# Patient Record
Sex: Female | Born: 1995 | Race: White | Hispanic: No | Marital: Single | Smoking: Never smoker
Health system: Southern US, Community
[De-identification: ages and names within clinical notes are randomized; demographics above are authoritative.]

## PROBLEM LIST (undated history)

## (undated) ENCOUNTER — Inpatient Hospital Stay (HOSPITAL_COMMUNITY): Payer: Self-pay

## (undated) DIAGNOSIS — R51 Headache: Secondary | ICD-10-CM

## (undated) DIAGNOSIS — F909 Attention-deficit hyperactivity disorder, unspecified type: Secondary | ICD-10-CM

## (undated) DIAGNOSIS — F32A Depression, unspecified: Secondary | ICD-10-CM

## (undated) DIAGNOSIS — F329 Major depressive disorder, single episode, unspecified: Secondary | ICD-10-CM

## (undated) HISTORY — DX: Headache: R51

---
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
## 2017-02-14 ENCOUNTER — Encounter (HOSPITAL_COMMUNITY): Payer: Self-pay

## 2017-02-14 ENCOUNTER — Emergency Department (HOSPITAL_COMMUNITY)
Admission: EM | Admit: 2017-02-14 | Discharge: 2017-02-14 | Discharge status: Against Medical Advice | Payer: Medicaid Other | Attending: Emergency Medicine | Admitting: Emergency Medicine

## 2017-02-14 DIAGNOSIS — R300 Dysuria: Secondary | ICD-10-CM | POA: Diagnosis not present

## 2017-02-14 DIAGNOSIS — Z5321 Procedure and treatment not carried out due to patient leaving prior to being seen by health care provider: Secondary | ICD-10-CM | POA: Diagnosis not present

## 2017-02-14 NOTE — ED Notes (Signed)
Called patient , but no answer

## 2017-02-14 NOTE — ED Notes (Signed)
Bed: WA13 Expected date:  Expected time:  Means of arrival:  Comments: 

## 2017-02-14 NOTE — ED Triage Notes (Signed)
Patient c/o hematuria and dysuria x 3-4 days ago. Patient denies any vaginal discharge.

## 2017-04-29 ENCOUNTER — Encounter (HOSPITAL_COMMUNITY): Payer: Self-pay | Admitting: Emergency Medicine

## 2017-04-29 DIAGNOSIS — Z5321 Procedure and treatment not carried out due to patient leaving prior to being seen by health care provider: Secondary | ICD-10-CM | POA: Insufficient documentation

## 2017-04-29 DIAGNOSIS — R1032 Left lower quadrant pain: Secondary | ICD-10-CM | POA: Diagnosis not present

## 2017-04-29 DIAGNOSIS — R309 Painful micturition, unspecified: Secondary | ICD-10-CM | POA: Insufficient documentation

## 2017-04-29 NOTE — ED Triage Notes (Signed)
Pt c/o left flank pain 8/10 for the past 2 days with some discomfort with urination, no fever, chills, nausea or vomiting.

## 2017-04-30 ENCOUNTER — Emergency Department (HOSPITAL_COMMUNITY): Payer: Medicaid Other

## 2017-04-30 ENCOUNTER — Emergency Department (HOSPITAL_COMMUNITY)
Admission: EM | Admit: 2017-04-30 | Discharge: 2017-04-30 | Disposition: A | Discharge status: Home / Self Care | Payer: Medicaid Other | Attending: Emergency Medicine | Admitting: Emergency Medicine

## 2017-04-30 DIAGNOSIS — R109 Unspecified abdominal pain: Secondary | ICD-10-CM

## 2017-04-30 LAB — URINALYSIS, ROUTINE W REFLEX MICROSCOPIC
Bacteria, UA: NONE SEEN
Bilirubin Urine: NEGATIVE
GLUCOSE, UA: NEGATIVE mg/dL
Hgb urine dipstick: NEGATIVE
Ketones, ur: NEGATIVE mg/dL
Nitrite: NEGATIVE
PH: 6 (ref 5.0–8.0)
Protein, ur: NEGATIVE mg/dL
SPECIFIC GRAVITY, URINE: 1.018 (ref 1.005–1.030)

## 2017-04-30 LAB — BASIC METABOLIC PANEL
ANION GAP: 9 (ref 5–15)
BUN: 10 mg/dL (ref 6–20)
CHLORIDE: 103 mmol/L (ref 101–111)
CO2: 21 mmol/L — ABNORMAL LOW (ref 22–32)
CREATININE: 0.58 mg/dL (ref 0.44–1.00)
Calcium: 8.9 mg/dL (ref 8.9–10.3)
GFR calc non Af Amer: >60 mL/min (ref >60)
Glucose, Bld: 99 mg/dL (ref 65–99)
POTASSIUM: 3.6 mmol/L (ref 3.5–5.1)
SODIUM: 133 mmol/L — AB (ref 135–145)

## 2017-04-30 LAB — I-STAT BETA HCG BLOOD, ED (MC, WL, AP ONLY): I-stat hCG, quantitative: >2000 m[IU]/mL — ABNORMAL HIGH (ref <5)

## 2017-04-30 LAB — CBC
HCT: 35.3 % — ABNORMAL LOW (ref 36.0–46.0)
HEMOGLOBIN: 11.4 g/dL — AB (ref 12.0–15.0)
MCH: 25.8 pg — ABNORMAL LOW (ref 26.0–34.0)
MCHC: 32.3 g/dL (ref 30.0–36.0)
MCV: 79.9 fL (ref 78.0–100.0)
PLATELETS: 260 10*3/uL (ref 150–400)
RBC: 4.42 MIL/uL (ref 3.87–5.11)
RDW: 15.1 % (ref 11.5–15.5)
WBC: 7.4 10*3/uL (ref 4.0–10.5)

## 2017-04-30 LAB — HCG, QUANTITATIVE, PREGNANCY: hCG, Beta Chain, Quant, S: 2861 m[IU]/mL — ABNORMAL HIGH (ref <5)

## 2017-04-30 NOTE — ED Notes (Signed)
Called pt no answer °

## 2017-04-30 NOTE — ED Notes (Signed)
Called multiple times , no answer.

## 2017-05-17 LAB — OB RESULTS CONSOLE GC/CHLAMYDIA
Chlamydia: NEGATIVE
GC PROBE AMP, GENITAL: NEGATIVE

## 2017-06-20 LAB — OB RESULTS CONSOLE ABO/RH: RH Type: NEGATIVE

## 2017-06-20 LAB — OB RESULTS CONSOLE ANTIBODY SCREEN: ANTIBODY SCREEN: NEGATIVE

## 2017-06-20 LAB — OB RESULTS CONSOLE HIV ANTIBODY (ROUTINE TESTING): HIV: NONREACTIVE

## 2017-06-20 LAB — OB RESULTS CONSOLE RUBELLA ANTIBODY, IGM: Rubella: IMMUNE

## 2017-06-20 LAB — OB RESULTS CONSOLE HEPATITIS B SURFACE ANTIGEN: Hepatitis B Surface Ag: NEGATIVE

## 2017-06-20 LAB — OB RESULTS CONSOLE RPR: RPR: NONREACTIVE

## 2017-06-28 ENCOUNTER — Encounter (HOSPITAL_COMMUNITY): Payer: Self-pay

## 2017-06-28 ENCOUNTER — Inpatient Hospital Stay (HOSPITAL_COMMUNITY)
Admission: AD | Admit: 2017-06-28 | Discharge: 2017-06-28 | Disposition: A | Discharge status: Home / Self Care | Payer: Medicaid Other | Source: Ambulatory Visit | Attending: Obstetrics and Gynecology | Admitting: Obstetrics and Gynecology

## 2017-06-28 DIAGNOSIS — W009XXA Unspecified fall due to ice and snow, initial encounter: Secondary | ICD-10-CM

## 2017-06-28 DIAGNOSIS — M545 Low back pain, unspecified: Secondary | ICD-10-CM

## 2017-06-28 DIAGNOSIS — W000XXA Fall on same level due to ice and snow, initial encounter: Secondary | ICD-10-CM | POA: Diagnosis not present

## 2017-06-28 DIAGNOSIS — O26891 Other specified pregnancy related conditions, first trimester: Secondary | ICD-10-CM | POA: Diagnosis not present

## 2017-06-28 DIAGNOSIS — O9A211 Injury, poisoning and certain other consequences of external causes complicating pregnancy, first trimester: Secondary | ICD-10-CM

## 2017-06-28 DIAGNOSIS — Z3491 Encounter for supervision of normal pregnancy, unspecified, first trimester: Secondary | ICD-10-CM

## 2017-06-28 DIAGNOSIS — S3992XA Unspecified injury of lower back, initial encounter: Secondary | ICD-10-CM

## 2017-06-28 DIAGNOSIS — Z3A12 12 weeks gestation of pregnancy: Secondary | ICD-10-CM | POA: Insufficient documentation

## 2017-06-28 LAB — URINALYSIS, ROUTINE W REFLEX MICROSCOPIC
Bilirubin Urine: NEGATIVE
GLUCOSE, UA: 50 mg/dL — AB
HGB URINE DIPSTICK: NEGATIVE
Ketones, ur: 20 mg/dL — AB
NITRITE: NEGATIVE
PH: 5 (ref 5.0–8.0)
Protein, ur: 30 mg/dL — AB
SPECIFIC GRAVITY, URINE: 1.031 — AB (ref 1.005–1.030)

## 2017-06-28 MED ORDER — ACETAMINOPHEN 500 MG PO TABS
1000.0000 mg | ORAL_TABLET | Freq: Once | ORAL | Status: AC
  Administered 2017-06-28: 1000 mg via ORAL
  Filled 2017-06-28: qty 2

## 2017-06-28 NOTE — MAU Note (Signed)
Pt fell at 1000 this morning on ice. Having back pain. No bleeding or LOF.

## 2017-06-28 NOTE — Discharge Instructions (Signed)

## 2017-06-28 NOTE — MAU Note (Signed)
Tried to review pt history but pt denied the TAB and all of the medical history I was reviewing. I also asked about the ER visit with the knee contusion to try to see things were charted under the wrong patient and she denied that also. I verified name DOB and SSN again and all matched her records.
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
## 2017-07-18 NOTE — L&D Delivery Note (Signed)
Patient was C/C/station not documented and pushed for approx 60 minutes with epidural.    NSVD  Female infant, Apgars 8/9, weight pending.   The patient had left labial laceration repaired with 3-0 vicryl. Fundus was firm. EBL was expected amount. Placenta was delivered intact. Vagina was clear.  Delayed cord clamping done for 30-60 seconds while warming baby. Baby was vigorous and doing skin to skin with mother.  Philip AspenALLAHAN, [place]un Monteverde

## 2017-09-26 ENCOUNTER — Inpatient Hospital Stay (HOSPITAL_COMMUNITY)
Admission: AD | Admit: 2017-09-26 | Discharge: 2017-09-26 | Disposition: A | Discharge status: Home / Self Care | Payer: Medicaid Other | Source: Ambulatory Visit | Attending: Obstetrics and Gynecology | Admitting: Obstetrics and Gynecology

## 2017-09-26 ENCOUNTER — Encounter (HOSPITAL_COMMUNITY): Payer: Self-pay | Admitting: *Deleted

## 2017-09-26 DIAGNOSIS — Z3A25 25 weeks gestation of pregnancy: Secondary | ICD-10-CM | POA: Insufficient documentation

## 2017-09-26 DIAGNOSIS — O4692 Antepartum hemorrhage, unspecified, second trimester: Secondary | ICD-10-CM | POA: Diagnosis present

## 2017-09-26 DIAGNOSIS — N189 Chronic kidney disease, unspecified: Secondary | ICD-10-CM | POA: Diagnosis not present

## 2017-09-26 DIAGNOSIS — O98812 Other maternal infectious and parasitic diseases complicating pregnancy, second trimester: Secondary | ICD-10-CM | POA: Insufficient documentation

## 2017-09-26 DIAGNOSIS — O98813 Other maternal infectious and parasitic diseases complicating pregnancy, third trimester: Secondary | ICD-10-CM | POA: Diagnosis not present

## 2017-09-26 DIAGNOSIS — O26832 Pregnancy related renal disease, second trimester: Secondary | ICD-10-CM | POA: Diagnosis not present

## 2017-09-26 DIAGNOSIS — B373 Candidiasis of vulva and vagina: Secondary | ICD-10-CM | POA: Insufficient documentation

## 2017-09-26 DIAGNOSIS — B3731 Acute candidiasis of vulva and vagina: Secondary | ICD-10-CM

## 2017-09-26 LAB — WET PREP, GENITAL
CLUE CELLS WET PREP: NONE SEEN
Sperm: NONE SEEN
TRICH WET PREP: NONE SEEN

## 2017-09-26 LAB — URINALYSIS, ROUTINE W REFLEX MICROSCOPIC
Bilirubin Urine: NEGATIVE
Glucose, UA: NEGATIVE mg/dL
KETONES UR: NEGATIVE mg/dL
Nitrite: NEGATIVE
PH: 6 (ref 5.0–8.0)
Protein, ur: NEGATIVE mg/dL
Specific Gravity, Urine: 1.02 (ref 1.005–1.030)

## 2017-09-26 MED ORDER — TERCONAZOLE 0.4 % VA CREA: 1.0000 | TOPICAL_CREAM | Freq: Every day | VAGINAL | 0 refills | Status: DC

## 2017-09-26 NOTE — MAU Provider Note (Signed)
History     CSN: [id number]  Arrival date and time: 09/26/17 4098   First Provider Initiated Contact with Patient 09/26/17 0112      Chief Complaint  Patient presents with  . Vaginal Itching  . Vaginal Bleeding   G2P0010 @25 .4 wks here with vaginal irritation and spotting. Vaginal irritation started 3 days ago. Vaginal discharge was white, thick, and itchy. Today around 6pm she saw pink on the toilet paper. No recent IC. No bleeding since. Denies cramping or ctx. Reports good FM.    OB History    Gravida Para Term Preterm AB Living   2       1     SAB TAB Ectopic Multiple Live Births     1            Past Medical History:  Diagnosis Date  . ADHD (attention deficit hyperactivity disorder)   . Chronic kidney disease    frequent uti  . Depression   . Headache(784.0)   . Thyroid disease     History reviewed. No pertinent surgical history.  Family History  Problem Relation Age of Onset  . Migraines Mother   . Migraines Paternal Grandmother   . Migraines Brother   . Migraines Unknown        Maternal great-grandmother  . Migraines Unknown        Two maternal great aunts  . Aneurysm Unknown        Maternal great uncle    Social History   Tobacco Use  . Smoking status: Never Smoker  . Smokeless tobacco: Never Used  Substance Use Topics  . Alcohol use: No  . Drug use: No    Allergies: No Known Allergies  Medications Prior to Admission  Medication Sig Dispense Refill Last Dose  . Prenatal Vit-Fe Fumarate-FA (MULTIVITAMIN-PRENATAL) 27-0.8 MG TABS tablet Take 1 tablet by mouth daily at 12 noon.   09/25/2017 at Unknown time    Review of Systems  Gastrointestinal: Negative for abdominal pain.  Genitourinary: Positive for vaginal bleeding and vaginal discharge.   Physical Exam   Blood pressure (!) 111/54, pulse 72, temperature 99 F (37.2 C), temperature source Oral, resp. rate 18, height 5\' 5"  (1.651 m), weight 154 lb (69.9 kg), last menstrual period  03/22/2017.  Physical Exam  Nursing note and vitals reviewed. Constitutional: She is oriented to person, place, and time. She appears well-developed and well-nourished. No distress.  HENT:  Head: Normocephalic and atraumatic.  Neck: Normal range of motion.  Cardiovascular: Normal rate.  Respiratory: Effort normal. No respiratory distress.  GI: Soft. She exhibits no distension. There is no tenderness.  gravid  Genitourinary:  Genitourinary Comments: External: no lesions or erythema Vagina: rugated, pink, moist, thick white discharge, no blood Cervix closed/long   Musculoskeletal: Normal range of motion.  Neurological: She is alert and oriented to person, place, and time.  Skin: Skin is warm and dry.  Psychiatric: She has a normal mood and affect.  EFM: 145 bpm, mod variability, no accels, no decels Toco: none  Results for orders placed or performed during the hospital encounter of 09/26/17 (from the past 24 hour(s))  Urinalysis, Routine w reflex microscopic     Status: Abnormal   Collection Time: 09/26/17 12:35 AM  Result Value Ref Range   Color, Urine YELLOW YELLOW   APPearance HAZY (A) CLEAR   Specific Gravity, Urine 1.020 1.005 - 1.030   pH 6.0 5.0 - 8.0   Glucose, UA NEGATIVE NEGATIVE mg/dL  Hgb urine dipstick MODERATE (A) NEGATIVE   Bilirubin Urine NEGATIVE NEGATIVE   Ketones, ur NEGATIVE NEGATIVE mg/dL   Protein, ur NEGATIVE NEGATIVE mg/dL   Nitrite NEGATIVE NEGATIVE   Leukocytes, UA LARGE (A) NEGATIVE   RBC / HPF TOO NUMEROUS TO COUNT 0 - 5 RBC/hpf   WBC, UA TOO NUMEROUS TO COUNT 0 - 5 WBC/hpf   Bacteria, UA RARE (A) NONE SEEN   Squamous Epithelial / LPF 0-5 (A) NONE SEEN   Mucus PRESENT   Wet prep, genital     Status: Abnormal   Collection Time: 09/26/17  1:16 AM  Result Value Ref Range   Yeast Wet Prep HPF POC PRESENT (A) NONE SEEN   Trich, Wet Prep NONE SEEN NONE SEEN   Clue Cells Wet Prep HPF POC NONE SEEN NONE SEEN   WBC, Wet Prep HPF POC MODERATE (A)  NONE SEEN   Sperm NONE SEEN    MAU Course  Procedures  MDM Labs ordered and reviewed. UA abnormal with WBC and RBS, will send UC. Spotting likely caused by irritation of vaginal mucosa. Presentation, clinical findings, and plan discussed with Dr. Claiborne Billingsallahan. Stable for discharge home.  Assessment and Plan   1. [redacted] weeks gestation of pregnancy   2. Yeast vaginitis    Discharge home Follow up in OB office as scheduled PTL precautions Rx Terazol  Allergies as of 09/26/2017   No Known Allergies     Medication List    TAKE these medications   multivitamin-prenatal 27-0.8 MG Tabs tablet Take 1 tablet by mouth daily at 12 noon.   terconazole 0.4 % vaginal cream Commonly known as:  TERAZOL 7 Place 1 applicator vaginally at bedtime.      Laurie Hansen, CNM 09/26/2017, 1:59 AM

## 2017-09-26 NOTE — MAU Note (Signed)
Pt states she went to the bathroom at work and noticed pink blood on the tissue. States Friday, she noticed itching and irritation in vagina. States it burns when she pees. Pt reports a thick, white discharge. Pt denies contractions. Reports good fetal movement.

## 2017-09-26 NOTE — Discharge Instructions (Signed)

## 2017-09-28 LAB — CULTURE, OB URINE

## 2017-12-04 ENCOUNTER — Encounter (HOSPITAL_COMMUNITY): Payer: Self-pay

## 2017-12-04 ENCOUNTER — Inpatient Hospital Stay (HOSPITAL_COMMUNITY)
Admission: AD | Admit: 2017-12-04 | Discharge: 2017-12-05 | Disposition: A | Discharge status: Home / Self Care | Payer: Medicaid Other | Source: Ambulatory Visit | Attending: Obstetrics and Gynecology | Admitting: Obstetrics and Gynecology

## 2017-12-04 DIAGNOSIS — Z3A35 35 weeks gestation of pregnancy: Secondary | ICD-10-CM | POA: Diagnosis not present

## 2017-12-04 DIAGNOSIS — N949 Unspecified condition associated with female genital organs and menstrual cycle: Secondary | ICD-10-CM

## 2017-12-04 DIAGNOSIS — O26893 Other specified pregnancy related conditions, third trimester: Secondary | ICD-10-CM | POA: Diagnosis not present

## 2017-12-04 DIAGNOSIS — O479 False labor, unspecified: Secondary | ICD-10-CM

## 2017-12-04 DIAGNOSIS — Z820 Family history of epilepsy and other diseases of the nervous system: Secondary | ICD-10-CM | POA: Insufficient documentation

## 2017-12-04 DIAGNOSIS — Z87440 Personal history of urinary (tract) infections: Secondary | ICD-10-CM | POA: Insufficient documentation

## 2017-12-04 DIAGNOSIS — R102 Pelvic and perineal pain unspecified side: Secondary | ICD-10-CM

## 2017-12-04 DIAGNOSIS — O4703 False labor before 37 completed weeks of gestation, third trimester: Secondary | ICD-10-CM | POA: Diagnosis not present

## 2017-12-04 DIAGNOSIS — R109 Unspecified abdominal pain: Secondary | ICD-10-CM | POA: Diagnosis present

## 2017-12-04 LAB — URINALYSIS, ROUTINE W REFLEX MICROSCOPIC
Bilirubin Urine: NEGATIVE
Glucose, UA: NEGATIVE mg/dL
Hgb urine dipstick: NEGATIVE
Ketones, ur: NEGATIVE mg/dL
Nitrite: NEGATIVE
Protein, ur: NEGATIVE mg/dL
Specific Gravity, Urine: 1.015 (ref 1.005–1.030)
pH: 6 (ref 5.0–8.0)

## 2017-12-04 NOTE — MAU Note (Signed)
Pt reports mid-lower back pain and lower abdominal cramping that started earlier today. Pt denies pain at this time but has a lot of pelvic pressure. States it feels like she needs to have a BM. Had a soft BM. Pt denies constipation. Pt denies vaginal bleeding or LOF. Reports good fetal movement

## 2017-12-05 NOTE — Discharge Instructions (Signed)
Braxton Hicks Contractions °Contractions of the uterus can occur throughout pregnancy, but they are not always a sign that you are in labor. You may have practice contractions called Braxton Hicks contractions. These false labor contractions are sometimes confused with true labor. °What are Braxton Hicks contractions? °Braxton Hicks contractions are tightening movements that occur in the muscles of the uterus before labor. Unlike true labor contractions, these contractions do not result in opening (dilation) and thinning of the cervix. Toward the end of pregnancy (32-34 weeks), Braxton Hicks contractions can happen more often and may become stronger. These contractions are sometimes difficult to tell apart from true labor because they can be very uncomfortable. You should not feel embarrassed if you go to the hospital with false labor. °Sometimes, the only way to tell if you are in true labor is for your health care provider to look for changes in the cervix. The health care provider will do a physical exam and may monitor your contractions. If you are not in true labor, the exam should show that your cervix is not dilating and your water has not broken. °If there are other health problems associated with your pregnancy, it is completely safe for you to be sent home with false labor. You may continue to have Braxton Hicks contractions until you go into true labor. °How to tell the difference between true labor and false labor °True labor °· Contractions last 30-70 seconds. °· Contractions become very regular. °· Discomfort is usually felt in the top of the uterus, and it spreads to the lower abdomen and low back. °· Contractions do not go away with walking. °· Contractions usually become more intense and increase in frequency. °· The cervix dilates and gets thinner. °False labor °· Contractions are usually shorter and not as strong as true labor contractions. °· Contractions are usually irregular. °· Contractions  are often felt in the front of the lower abdomen and in the groin. °· Contractions may go away when you walk around or change positions while lying down. °· Contractions get weaker and are shorter-lasting as time goes on. °· The cervix usually does not dilate or become thin. °Follow these instructions at home: °· Take over-the-counter and prescription medicines only as told by your health care provider. °· Keep up with your usual exercises and follow other instructions from your health care provider. °· Eat and drink lightly if you think you are going into labor. °· If Braxton Hicks contractions are making you uncomfortable: °? Change your position from lying down or resting to walking, or change from walking to resting. °? Sit and rest in a tub of warm water. °? Drink enough fluid to keep your urine pale yellow. Dehydration may cause these contractions. °? Do slow and deep breathing several times an hour. °· Keep all follow-up prenatal visits as told by your health care provider. This is important. °Contact a health care provider if: °· You have a fever. °· You have continuous pain in your abdomen. °Get help right away if: °· Your contractions become stronger, more regular, and closer together. °· You have fluid leaking or gushing from your vagina. °· You pass blood-tinged mucus (bloody show). °· You have bleeding from your vagina. °· You have low back pain that you never had before. °· You feel your baby’s head pushing down and causing pelvic pressure. °· Your baby is not moving inside you as much as it used to. °Summary °· Contractions that occur before labor are called Braxton   Hicks contractions, false labor, or practice contractions. °· Braxton Hicks contractions are usually shorter, weaker, farther apart, and less regular than true labor contractions. True labor contractions usually become progressively stronger and regular and they become more frequent. °· Manage discomfort from Braxton Hicks contractions by  changing position, resting in a warm bath, drinking plenty of water, or practicing deep breathing. °This information is not intended to replace advice given to you by your health care provider. Make sure you discuss any questions you have with your health care provider. °Document Released: 11/17/2016 Document Revised: 11/17/2016 Document Reviewed: 11/17/2016 °Elsevier Interactive Patient Education © 2018 [employer] ° °

## 2017-12-05 NOTE — MAU Provider Note (Signed)
Chief Complaint:  Contractions   First Provider Initiated Contact with Patient 12/04/17 2251      HPI: Laurie Hansen is a 22 y.o. G2P0010 at 81w4dwho presents to maternity admissions reporting abdominal pain and back pain. She reports waking up this morning with lower abdominal cramping and pressure, reports that at work the pain began to get worse and pain radiated to her lower back. She rates her pain as 8/10 when the pain was occurring, she reports 0 pain once she got home from work and rested. She reports continuation of no pain since presenting to MAU- reports pelvic pressure but denies pain, cramping, or contractions.  She reports good fetal movement, denies LOF, vaginal bleeding, vaginal itching/burning, urinary symptoms, h/a, dizziness, n/v, or fever/chills.    Past Medical History: Past Medical History:  Diagnosis Date  . ADHD (attention deficit hyperactivity disorder)   . Chronic kidney disease    frequent uti  . Depression   . Headache(784.0)   . Thyroid disease     Past obstetric history: OB History  Gravida Para Term Preterm AB Living  2       1    SAB TAB Ectopic Multiple Live Births    1          # Outcome Date GA Lbr Len/2nd Weight Sex Delivery Anes PTL Lv  2 Current           1 TAB 02/11/16 [redacted]w[redacted]d           Past Surgical History: History reviewed. No pertinent surgical history.  Family History: Family History  Problem Relation Age of Onset  . Migraines Mother   . Migraines Paternal Grandmother   . Migraines Brother   . Migraines Unknown        Maternal great-grandmother  . Migraines Unknown        Two maternal great aunts  . Aneurysm Unknown        Maternal great uncle    Social History: Social History   Tobacco Use  . Smoking status: Never Smoker  . Smokeless tobacco: Never Used  Substance Use Topics  . Alcohol use: No  . Drug use: No    Allergies: No Known Allergies  Meds:  Medications Prior to Admission  Medication Sig Dispense  Refill Last Dose  . Prenatal Vit-Fe Fumarate-FA (MULTIVITAMIN-PRENATAL) 27-0.8 MG TABS tablet Take 1 tablet by mouth daily at 12 noon.   Past Week at Unknown time  . terconazole (TERAZOL 7) 0.4 % vaginal cream Place 1 applicator vaginally at bedtime. 45 g 0 More than a month at Unknown time    ROS:  Review of Systems  Respiratory: Negative.   Cardiovascular: Negative.   Gastrointestinal: Positive for abdominal pain. Negative for constipation, diarrhea, nausea and vomiting.       None currently  Genitourinary: Negative for difficulty urinating, dysuria, frequency, urgency, vaginal bleeding and vaginal discharge.       Pelvic pressure  Musculoskeletal: Positive for back pain.       None currently   I have reviewed patient's Past Medical Hx, Surgical Hx, Family Hx, Social Hx, medications and allergies.   Physical Exam   Patient Vitals for the past 24 hrs:  BP Temp Temp src Pulse Resp SpO2 Height Weight  12/05/17 0033 102/61 - - - - - - -  12/04/17 2159 112/65 98.4 F (36.9 C) Oral 87 16 97 %  (1.651 m) 165 lb (74.8 kg)   Constitutional: Well-developed, well-nourished female in no  acute distress.  Cardiovascular: normal rate Respiratory: normal effort GI: Abd soft, non-tender, gravid appropriate for gestational age.  Neurologic: Alert and oriented x 4.  GU: Neg CVAT.  Cervical  EXAM: no change in cervix after reassessment in 1.5 hours Dilation: 1.5 Effacement (%): 70 Cervical Position: Posterior Station: -3 Presentation: Vertex Exam by:: Steward Drone CNM  FHT:  Baseline 135 , moderate variability, accelerations present, no decelerations Contractions: irregular mild contractions    Labs: Results for orders placed or performed during the hospital encounter of 12/04/17 (from the past 24 hour(s))  Urinalysis, Routine w reflex microscopic     Status: Abnormal   Collection Time: 12/04/17  9:54 PM  Result Value Ref Range   Color, Urine YELLOW YELLOW   APPearance CLEAR  CLEAR   Specific Gravity, Urine 1.015 1.005 - 1.030   pH 6.0 5.0 - 8.0   Glucose, UA NEGATIVE NEGATIVE mg/dL   Hgb urine dipstick NEGATIVE NEGATIVE   Bilirubin Urine NEGATIVE NEGATIVE   Ketones, ur NEGATIVE NEGATIVE mg/dL   Protein, ur NEGATIVE NEGATIVE mg/dL   Nitrite NEGATIVE NEGATIVE   Leukocytes, UA MODERATE (A) NEGATIVE   RBC / HPF 0-5 0 - 5 RBC/hpf   WBC, UA 11-20 0 - 5 WBC/hpf   Bacteria, UA RARE (A) NONE SEEN   Squamous Epithelial / LPF 0-5 0 - 5   Mucus PRESENT     MAU Course/MDM: Orders Placed This Encounter  Procedures  . Culture, OB Urine  . Urinalysis, Routine w reflex microscopic  . Discharge patient Discharge disposition: 01-Home or Self Care; Discharge patient date: 12/05/2017   Urine culture pending   NST reviewed- reactive Consult Dr Claiborne Billings with presentation and exam findings. Okay to discharge home with follow up in the office as scheduled.   Pt discharge with labor precautions   Today's evaluation included a work-up for preterm labor which can be life-threatening for both mom and baby.  Assessment: 1. Braxton Hicks contractions   2. Feeling pelvic pressure during pregnancy in third trimester, antepartum     Plan: Discharge home. Pt stable prior to discharge.  Labor precautions and fetal kick counts Follow up as scheduled in the office for prenatal care  Return to MAU as needed  Will call patient if urine culture comes back positive   Follow-up Information    Ob/Gyn, [facility]amp Follow up.   Why:  Follow up as scheduled in the office for prenatal care Contact information: [address] [place] [address] [phone]           Allergies as of 12/05/2017   No Known Allergies     Medication List    STOP taking these medications   terconazole 0.4 % vaginal cream Commonly known as:  TERAZOL 7     TAKE these medications   multivitamin-prenatal 27-0.8 MG Tabs tablet Take 1 tablet by mouth daily at 12 noon.        Steward Drone Certified Nurse-Midwife 12/05/2017 12:28 AM

## 2017-12-06 LAB — CULTURE, OB URINE

## 2018-01-03 ENCOUNTER — Encounter (HOSPITAL_COMMUNITY): Payer: Self-pay | Admitting: *Deleted

## 2018-01-03 ENCOUNTER — Inpatient Hospital Stay (HOSPITAL_COMMUNITY): Payer: Medicaid Other | Admitting: Anesthesiology

## 2018-01-03 ENCOUNTER — Other Ambulatory Visit: Payer: Self-pay

## 2018-01-03 ENCOUNTER — Inpatient Hospital Stay (HOSPITAL_COMMUNITY)
Admission: AD | Admit: 2018-01-03 | Discharge: 2018-01-06 | DRG: 807 | Disposition: A | Discharge status: Home / Self Care | Payer: Medicaid Other | Attending: Obstetrics and Gynecology | Admitting: Obstetrics and Gynecology

## 2018-01-03 DIAGNOSIS — Z3A39 39 weeks gestation of pregnancy: Secondary | ICD-10-CM

## 2018-01-03 DIAGNOSIS — Z3483 Encounter for supervision of other normal pregnancy, third trimester: Secondary | ICD-10-CM | POA: Diagnosis present

## 2018-01-03 DIAGNOSIS — D649 Anemia, unspecified: Secondary | ICD-10-CM | POA: Diagnosis present

## 2018-01-03 DIAGNOSIS — O99824 Streptococcus B carrier state complicating childbirth: Principal | ICD-10-CM | POA: Diagnosis present

## 2018-01-03 DIAGNOSIS — O9902 Anemia complicating childbirth: Secondary | ICD-10-CM | POA: Diagnosis present

## 2018-01-03 LAB — CBC
HCT: 32.3 % — ABNORMAL LOW (ref 36.0–46.0)
Hemoglobin: 10.2 g/dL — ABNORMAL LOW (ref 12.0–15.0)
MCH: 24.3 pg — AB (ref 26.0–34.0)
MCHC: 31.6 g/dL (ref 30.0–36.0)
MCV: 76.9 fL — ABNORMAL LOW (ref 78.0–100.0)
Platelets: 197 10*3/uL (ref 150–400)
RBC: 4.2 MIL/uL (ref 3.87–5.11)
RDW: 14.6 % (ref 11.5–15.5)
WBC: 7.6 10*3/uL (ref 4.0–10.5)

## 2018-01-03 LAB — OB RESULTS CONSOLE GBS: GBS: POSITIVE

## 2018-01-03 MED ORDER — SOD CITRATE-CITRIC ACID 500-334 MG/5ML PO SOLN: 30.0000 mL | ORAL | Status: DC | PRN

## 2018-01-03 MED ORDER — OXYCODONE-ACETAMINOPHEN 5-325 MG PO TABS: 1.0000 | ORAL_TABLET | ORAL | Status: DC | PRN

## 2018-01-03 MED ORDER — ONDANSETRON HCL 4 MG/2ML IJ SOLN: 4.0000 mg | Freq: Four times a day (QID) | INTRAMUSCULAR | Status: DC | PRN

## 2018-01-03 MED ORDER — LACTATED RINGERS IV SOLN: 500.0000 mL | INTRAVENOUS | Status: DC | PRN

## 2018-01-03 MED ORDER — LACTATED RINGERS IV SOLN
INTRAVENOUS | Status: DC
  Administered 2018-01-03: 21:00:00 via INTRAVENOUS

## 2018-01-03 MED ORDER — LIDOCAINE HCL (PF) 1 % IJ SOLN
30.0000 mL | INTRAMUSCULAR | Status: DC | PRN
  Administered 2018-01-04: 30 mL via SUBCUTANEOUS
  Filled 2018-01-03: qty 30

## 2018-01-03 MED ORDER — PHENYLEPHRINE 40 MCG/ML (10ML) SYRINGE FOR IV PUSH (FOR BLOOD PRESSURE SUPPORT)
80.0000 ug | PREFILLED_SYRINGE | INTRAVENOUS | Status: DC | PRN
  Filled 2018-01-03: qty 5

## 2018-01-03 MED ORDER — OXYTOCIN 40 UNITS IN LACTATED RINGERS INFUSION - SIMPLE MED
2.5000 [IU]/h | INTRAVENOUS | Status: DC
  Filled 2018-01-03: qty 1000

## 2018-01-03 MED ORDER — ACETAMINOPHEN 325 MG PO TABS: 650.0000 mg | ORAL_TABLET | ORAL | Status: DC | PRN

## 2018-01-03 MED ORDER — FLEET ENEMA 7-19 GM/118ML RE ENEM: 1.0000 | ENEMA | RECTAL | Status: DC | PRN

## 2018-01-03 MED ORDER — LACTATED RINGERS IV SOLN
500.0000 mL | Freq: Once | INTRAVENOUS | Status: AC
  Administered 2018-01-03: 500 mL via INTRAVENOUS

## 2018-01-03 MED ORDER — OXYCODONE-ACETAMINOPHEN 5-325 MG PO TABS: 2.0000 | ORAL_TABLET | ORAL | Status: DC | PRN

## 2018-01-03 MED ORDER — DIPHENHYDRAMINE HCL 50 MG/ML IJ SOLN: 12.5000 mg | INTRAMUSCULAR | Status: DC | PRN

## 2018-01-03 MED ORDER — FENTANYL 2.5 MCG/ML BUPIVACAINE 1/10 % EPIDURAL INFUSION (WH - ANES)
INTRAMUSCULAR | Status: AC
  Filled 2018-01-03: qty 100

## 2018-01-03 MED ORDER — PHENYLEPHRINE 40 MCG/ML (10ML) SYRINGE FOR IV PUSH (FOR BLOOD PRESSURE SUPPORT)
PREFILLED_SYRINGE | INTRAVENOUS | Status: AC
  Filled 2018-01-03: qty 20

## 2018-01-03 MED ORDER — OXYTOCIN BOLUS FROM INFUSION
500.0000 mL | Freq: Once | INTRAVENOUS | Status: AC
  Administered 2018-01-04: 500 mL via INTRAVENOUS

## 2018-01-03 MED ORDER — EPHEDRINE 5 MG/ML INJ
10.0000 mg | INTRAVENOUS | Status: DC | PRN
  Filled 2018-01-03: qty 2

## 2018-01-03 MED ORDER — FENTANYL 2.5 MCG/ML BUPIVACAINE 1/10 % EPIDURAL INFUSION (WH - ANES)
14.0000 mL/h | INTRAMUSCULAR | Status: DC | PRN
  Administered 2018-01-03: 14 mL/h via EPIDURAL

## 2018-01-03 MED ORDER — AMPICILLIN SODIUM 2 G IJ SOLR
2.0000 g | Freq: Once | INTRAMUSCULAR | Status: AC
  Administered 2018-01-03: 2 g via INTRAVENOUS
  Filled 2018-01-03: qty 2

## 2018-01-03 MED ORDER — LIDOCAINE HCL (PF) 1 % IJ SOLN
INTRAMUSCULAR | Status: DC | PRN
  Administered 2018-01-03: 4 mL via EPIDURAL
  Administered 2018-01-03: 6 mL via EPIDURAL

## 2018-01-03 NOTE — MAU Note (Signed)
Pt presents to MAU c/o ctx every . Pt states her ctx started around 0700 and have progressed all day. Pt states she was 5cm. No bleeding or leaking +FM. FHR 160.
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
## 2018-01-03 NOTE — Anesthesia Procedure Notes (Signed)
Epidural Patient location during procedure: OB Start time: 01/03/2018 9:26 PM End time: 01/03/2018 9:30 PM  Staffing Anesthesiologist: Beryle LatheBrock, Alauna Hayden E, MD Performed: anesthesiologist   Preanesthetic Checklist Completed: patient identified, pre-op evaluation, timeout performed, IV checked, risks and benefits discussed and monitors and equipment checked  Epidural Patient position: sitting Prep: DuraPrep Patient monitoring: continuous pulse ox and blood pressure Approach: midline Location: L2-L3 Injection technique: LOR saline  Needle:  Needle type: Tuohy  Needle gauge: 17 G Needle length: 9 cm Needle insertion depth: 7 cm Catheter size: 19 Gauge Catheter at skin depth: 12 cm Test dose: negative and Other (1% lidocaine)  Additional Notes Patient identified. Risks including, but not limited to, bleeding, infection, nerve damage, paralysis, inadequate analgesia, blood pressure changes, nausea, vomiting, allergic reaction, postpartum back pain, itching, and headache were discussed. Patient expressed understanding and wished to proceed. Sterile prep and drape, including hand hygiene, mask, and sterile gloves were used. The patient was positioned and the spine was prepped. The skin was anesthetized with lidocaine. No paraesthesia or other complication noted. The patient did not experience any signs of intravascular injection such as tinnitus or metallic taste in mouth, nor signs of intrathecal spread such as rapid motor block. Please see nursing notes for vital signs. The patient tolerated the procedure well.   Laurie Hansen, MDReason for block:procedure for pain

## 2018-01-03 NOTE — Anesthesia Preprocedure Evaluation (Signed)
Anesthesia Evaluation  Patient identified by MRN, date of birth, ID band Patient awake    Reviewed: Allergy & Precautions, NPO status , Patient's Chart, lab work & pertinent test results  Airway Mallampati: II  TM Distance: >3 FB Neck ROM: Full    Dental  (+) Dental Advisory Given   Pulmonary neg pulmonary ROS,    breath sounds clear to auscultation       Cardiovascular negative cardio ROS   Rhythm:Regular Rate:Normal     Neuro/Psych  Headaches, Depression    GI/Hepatic negative GI ROS, Neg liver ROS,   Endo/Other  negative endocrine ROS  Renal/GU negative Renal ROS  negative genitourinary   Musculoskeletal negative musculoskeletal ROS (+)   Abdominal   Peds  (+) ADHD Hematology  (+) anemia ,   Anesthesia Other Findings   Reproductive/Obstetrics (+) Pregnancy                             Anesthesia Physical Anesthesia Plan  ASA: II  Anesthesia Plan: Epidural   Post-op Pain Management:    Induction:   PONV Risk Score and Plan:   Airway Management Planned: Natural Airway  Additional Equipment: None  Intra-op Plan:   Post-operative Plan:   Informed Consent: I have reviewed the patients History and Physical, chart, labs and discussed the procedure including the risks, benefits and alternatives for the proposed anesthesia with the patient or authorized representative who has indicated his/her understanding and acceptance.     Plan Discussed with: Anesthesiologist  Anesthesia Plan Comments: (Labs reviewed. Platelets acceptable, patient not taking any blood thinning medications. Risks and benefits discussed with patient, patient expressed understanding and wished to proceed.)        Anesthesia Quick Evaluation

## 2018-01-04 ENCOUNTER — Encounter (HOSPITAL_COMMUNITY): Payer: Self-pay | Admitting: *Deleted

## 2018-01-04 LAB — CBC
HEMATOCRIT: 29.7 % — AB (ref 36.0–46.0)
HEMOGLOBIN: 9.4 g/dL — AB (ref 12.0–15.0)
MCH: 24.1 pg — ABNORMAL LOW (ref 26.0–34.0)
MCHC: 31.6 g/dL (ref 30.0–36.0)
MCV: 76.2 fL — AB (ref 78.0–100.0)
Platelets: 170 10*3/uL (ref 150–400)
RBC: 3.9 MIL/uL (ref 3.87–5.11)
RDW: 14.6 % (ref 11.5–15.5)
WBC: 9.3 10*3/uL (ref 4.0–10.5)

## 2018-01-04 LAB — RPR: RPR: NONREACTIVE

## 2018-01-04 MED ORDER — TETANUS-DIPHTH-ACELL PERTUSSIS 5-2.5-18.5 LF-MCG/0.5 IM SUSP: 0.5000 mL | Freq: Once | INTRAMUSCULAR | Status: DC

## 2018-01-04 MED ORDER — COCONUT OIL OIL: 1.0000 "application " | TOPICAL_OIL | Status: DC | PRN

## 2018-01-04 MED ORDER — BENZOCAINE-MENTHOL 20-0.5 % EX AERO
1.0000 "application " | INHALATION_SPRAY | CUTANEOUS | Status: DC | PRN
  Filled 2018-01-04: qty 56

## 2018-01-04 MED ORDER — ACETAMINOPHEN 325 MG PO TABS: 650.0000 mg | ORAL_TABLET | ORAL | Status: DC | PRN

## 2018-01-04 MED ORDER — OXYCODONE-ACETAMINOPHEN 5-325 MG PO TABS: 2.0000 | ORAL_TABLET | ORAL | Status: DC | PRN

## 2018-01-04 MED ORDER — SIMETHICONE 80 MG PO CHEW
80.0000 mg | CHEWABLE_TABLET | ORAL | Status: DC | PRN
Start: 2018-01-04 — End: 2018-01-06

## 2018-01-04 MED ORDER — ONDANSETRON HCL 4 MG PO TABS: 4.0000 mg | ORAL_TABLET | ORAL | Status: DC | PRN

## 2018-01-04 MED ORDER — WITCH HAZEL-GLYCERIN EX PADS: 1.0000 "application " | MEDICATED_PAD | CUTANEOUS | Status: DC | PRN

## 2018-01-04 MED ORDER — PRENATAL MULTIVITAMIN CH
1.0000 | ORAL_TABLET | Freq: Every day | ORAL | Status: DC
  Administered 2018-01-04 – 2018-01-06 (×3): 1 via ORAL
  Filled 2018-01-04 (×3): qty 1

## 2018-01-04 MED ORDER — ZOLPIDEM TARTRATE 5 MG PO TABS
5.0000 mg | ORAL_TABLET | Freq: Every evening | ORAL | Status: DC | PRN
Start: 2018-01-04 — End: 2018-01-06

## 2018-01-04 MED ORDER — ONDANSETRON HCL 4 MG/2ML IJ SOLN: 4.0000 mg | INTRAMUSCULAR | Status: DC | PRN

## 2018-01-04 MED ORDER — OXYCODONE-ACETAMINOPHEN 5-325 MG PO TABS: 1.0000 | ORAL_TABLET | ORAL | Status: DC | PRN

## 2018-01-04 MED ORDER — DIPHENHYDRAMINE HCL 25 MG PO CAPS: 25.0000 mg | ORAL_CAPSULE | Freq: Four times a day (QID) | ORAL | Status: DC | PRN

## 2018-01-04 MED ORDER — DIBUCAINE 1 % RE OINT: 1.0000 "application " | TOPICAL_OINTMENT | RECTAL | Status: DC | PRN

## 2018-01-04 MED ORDER — SENNOSIDES-DOCUSATE SODIUM 8.6-50 MG PO TABS
2.0000 | ORAL_TABLET | ORAL | Status: DC
  Administered 2018-01-04 – 2018-01-05 (×2): 2 via ORAL
  Filled 2018-01-04 (×2): qty 2

## 2018-01-04 MED ORDER — IBUPROFEN 600 MG PO TABS
600.0000 mg | ORAL_TABLET | Freq: Four times a day (QID) | ORAL | Status: DC
  Administered 2018-01-04 – 2018-01-06 (×9): 600 mg via ORAL
  Filled 2018-01-04 (×10): qty 1

## 2018-01-04 NOTE — Progress Notes (Signed)
Parent request formula to supplement breast feeding due to_mothers choice___. Parents have been informed of small tummy size of newborn, taught hand expression and understands the possible consequences of formula to the health of the infant. The possible consequences shared with patient include 1) Loss of confidence in breastfeeding 2) Engorgement 3) Allergic sensitization of baby(asthma/allergies) and 4) decreased milk supply for mother.After discussion of the above the mother decided to ___supplement___. The tool used to give formula supplement will be (curved tip syringe,feeding tube with gloved finger,cup,spoon,SNS)_bottle__.Mother counseled to avoid artificial nipples because this practice Laurie Hansen lead to latch difficulties,inadequate milk transfer and nipple soreness.

## 2018-01-04 NOTE — Lactation Note (Addendum)
This note was copied from a baby's chart. Lactation Consultation Note  Patient Name: Laurie Della Gooautica Kidane [id number]'s Date: 01/04/2018 Reason for consult: Initial assessment  Initial visit at 13 hours of life. Mom is a P1 who reports + breast changes w/pregnancy. Infant has now been to the breast 4+ times & received 5mL of formula (via bottle).   When I entered room, Mom was getting assistance in putting infant to breast by MGM?. I assisted Mom with baby's alignment at breast while Mom was in side-lying position. Mom was taught sound of swallows when infant at breast. Doing breast compression during feedings to aid in transfer was also discussed.  Mom does not have WIC, but may be eligible. I provided Mom w/a hand pump for use at home, but I requested she use it if additional formula is given. Cleaning & assembly of parts discussed.  Mom was made aware of O/P services, breastfeeding support groups, community resources, and our phone # for post-discharge questions. Pacifiers were noted in room; I recommended not to use them at this time.   [name], [name] [name] 01/04/2018, 3:02 PM

## 2018-01-04 NOTE — Anesthesia Postprocedure Evaluation (Signed)
Anesthesia Post Note  Patient: Laurie Hansen  Procedure(s) Performed: AN AD HOC LABOR EPIDURAL     Patient location during evaluation: Mother Baby Anesthesia Type: Epidural Level of consciousness: awake, awake and alert and oriented Pain management: pain level controlled Vital Signs Assessment: post-procedure vital signs reviewed and stable Respiratory status: spontaneous breathing, nonlabored ventilation and respiratory function stable Cardiovascular status: stable Postop Assessment: no headache, no backache, patient able to bend at knees, no apparent nausea or vomiting, adequate PO intake and able to ambulate Anesthetic complications: no    Last Vitals:  Vitals:   01/04/18 0331 01/04/18 0350  BP: 105/87 117/83  Pulse: 83 78  Resp:  16  Temp:  37.2 C  SpO2:  98%    Last Pain:  Vitals:   01/04/18 0450  TempSrc:   PainSc: 0-No pain   Pain Goal:                 Laurie Hansen

## 2018-01-04 NOTE — Progress Notes (Signed)

## 2018-01-05 NOTE — Progress Notes (Signed)
Parent request formula to supplement breast feeding due to no longer wanting to breast feed. Parents have been informed of small tummy size of newborn, taught hand expression and understands the possible consequences of formula to the health of the infant. The possible consequences shared with patient include 1) Loss of confidence in breastfeeding 2) Engorgement 3) Allergic sensitization of baby(asthma/allergies) and 4) decreased milk supply for mother.After discussion of the above the mother decided to supplement with formula. The tool used to give formula supplement will be (curved tip syringe,feeding tube with gloved finger,cup,spoon,SNS) a bottle with a slow flow nipple.Mother counseled to avoid artificial nipples because this practice may lead to latch difficulties,inadequate milk transfer and nipple soreness.

## 2018-01-05 NOTE — Progress Notes (Signed)
Post Partum Day 1 Subjective: C/o low back pain. Bleeding appropriate. Breast and bottle feeding  Objective: Blood pressure 103/72, pulse 74, temperature 98.5 F (36.9 C), temperature source Oral, resp. rate 17, height 5\' 5"  (1.651 m), weight 75.8 kg (167 lb 1.3 oz), last menstrual period 03/22/2017, SpO2 96 %, unknown if currently breastfeeding.  Physical Exam:  General: alert, cooperative and appears stated age Lochia: appropriate Uterine Fundus: firm Incision: healing well DVT Evaluation: No evidence of DVT seen on physical exam.  Recent Labs    01/03/18 2047 01/04/18 0653  HGB 10.2* 9.4*  HCT 32.3* 29.7*    Assessment/Plan: Plan for discharge tomorrow   LOS: 2 days   Waynard ReedsKendra Rett Stehlik 01/05/2018, 11:24 AM

## 2018-01-06 MED ORDER — OXYCODONE-ACETAMINOPHEN 5-325 MG PO TABS: 1.0000 | ORAL_TABLET | Freq: Four times a day (QID) | ORAL | 0 refills | Status: DC | PRN

## 2018-01-06 MED ORDER — IBUPROFEN 600 MG PO TABS: 600.0000 mg | ORAL_TABLET | Freq: Four times a day (QID) | ORAL | 0 refills | Status: DC | PRN

## 2018-01-06 NOTE — Discharge Instructions (Signed)

## 2018-01-06 NOTE — Discharge Summary (Signed)
Obstetric Discharge Summary Reason for Admission: onset of labor Prenatal Procedures: ultrasound Intrapartum Procedures: spontaneous vaginal delivery Postpartum Procedures: none Complications-Operative and Postpartum: none Hemoglobin  Date Value Ref Range Status  01/04/2018 9.4 (L) 12.0 - 15.0 g/dL Final   HCT  Date Value Ref Range Status  01/04/2018 29.7 (L) 36.0 - 46.0 % Final    Physical Exam:  General: alert, cooperative and appears stated age 67Lochia: appropriate Uterine Fundus: firm   Discharge Diagnoses: Term Pregnancy-delivered  Discharge Information: Date: 01/06/2018 Activity: pelvic rest Diet: routine Medications: Ibuprofen and Percocet Condition: improved Instructions: refer to practice specific booklet Discharge to: home Follow-up Information    [name], [name], MD Follow up.   Specialty:  Obstetrics and Gynecology Contact information: [address] [place] [phone]           Newborn Data: Live born female  Birth Weight: 7 lb 12.7 oz (3535 g) APGAR: 8, 9  Newborn Delivery   Birth date/time:  01/04/2018 02:01:00 Delivery type:  Vaginal, Spontaneous     Home with mother.  Laurie Hansen 01/06/2018, 11:17 AM

## 2018-01-07 LAB — TYPE AND SCREEN
ABO/RH(D): A NEG
Antibody Screen: POSITIVE
UNIT DIVISION: 0
Unit division: 0

## 2018-01-07 LAB — BPAM RBC
BLOOD PRODUCT EXPIRATION DATE: 201907202359
Blood Product Expiration Date: 201907202359
UNIT TYPE AND RH: 600
Unit Type and Rh: 600

## 2018-02-02 DIAGNOSIS — Z1389 Encounter for screening for other disorder: Secondary | ICD-10-CM | POA: Diagnosis not present

## 2018-02-12 DIAGNOSIS — Z3202 Encounter for pregnancy test, result negative: Secondary | ICD-10-CM | POA: Diagnosis not present

## 2018-02-12 DIAGNOSIS — Z3042 Encounter for surveillance of injectable contraceptive: Secondary | ICD-10-CM | POA: Diagnosis not present

## 2018-02-22 ENCOUNTER — Emergency Department (HOSPITAL_COMMUNITY)
Admission: EM | Admit: 2018-02-22 | Discharge: 2018-02-22 | Disposition: A | Discharge status: Home / Self Care | Payer: Medicaid Other | Attending: Emergency Medicine | Admitting: Emergency Medicine

## 2018-02-22 ENCOUNTER — Emergency Department (HOSPITAL_COMMUNITY): Payer: Medicaid Other

## 2018-02-22 DIAGNOSIS — Y939 Activity, unspecified: Secondary | ICD-10-CM | POA: Diagnosis not present

## 2018-02-22 DIAGNOSIS — R609 Edema, unspecified: Secondary | ICD-10-CM | POA: Diagnosis not present

## 2018-02-22 DIAGNOSIS — Y929 Unspecified place or not applicable: Secondary | ICD-10-CM | POA: Diagnosis not present

## 2018-02-22 DIAGNOSIS — R51 Headache: Secondary | ICD-10-CM | POA: Insufficient documentation

## 2018-02-22 DIAGNOSIS — Y998 Other external cause status: Secondary | ICD-10-CM | POA: Insufficient documentation

## 2018-02-22 DIAGNOSIS — S0083XA Contusion of other part of head, initial encounter: Secondary | ICD-10-CM | POA: Insufficient documentation

## 2018-02-22 DIAGNOSIS — R55 Syncope and collapse: Secondary | ICD-10-CM | POA: Diagnosis not present

## 2018-02-22 DIAGNOSIS — S0993XA Unspecified injury of face, initial encounter: Secondary | ICD-10-CM | POA: Diagnosis not present

## 2018-02-22 DIAGNOSIS — S199XXA Unspecified injury of neck, initial encounter: Secondary | ICD-10-CM | POA: Diagnosis not present

## 2018-02-22 DIAGNOSIS — S0990XA Unspecified injury of head, initial encounter: Secondary | ICD-10-CM | POA: Diagnosis not present

## 2018-02-22 MED ORDER — OXYCODONE-ACETAMINOPHEN 5-325 MG PO TABS
1.0000 | ORAL_TABLET | Freq: Once | ORAL | Status: AC
  Administered 2018-02-22: 1 via ORAL
  Filled 2018-02-22: qty 1

## 2018-02-22 MED ORDER — HYDROCODONE-ACETAMINOPHEN 5-325 MG PO TABS: 1.0000 | ORAL_TABLET | ORAL | 0 refills | Status: DC | PRN

## 2018-02-22 NOTE — ED Notes (Signed)
Reviewed d/c instructions with pt, who had no outstanding questions and verbalized understanding. Pt departed in NAD.

## 2018-02-22 NOTE — Discharge Instructions (Signed)
At this time it is unclear if you ruptured your eardrum.  Try not to get any water in the ear when you are showering.  Do not swim.  Follow-up with Dr. Jearld FentonByers, ear nose and throat specialist for further evaluation.
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
## 2018-03-02 DIAGNOSIS — Z113 Encounter for screening for infections with a predominantly sexual mode of transmission: Secondary | ICD-10-CM | POA: Diagnosis not present

## 2018-03-02 DIAGNOSIS — A599 Trichomoniasis, unspecified: Secondary | ICD-10-CM | POA: Diagnosis not present

## 2018-11-28 ENCOUNTER — Other Ambulatory Visit: Payer: Self-pay

## 2018-11-28 ENCOUNTER — Encounter (HOSPITAL_COMMUNITY): Payer: Self-pay | Admitting: Emergency Medicine

## 2018-11-28 ENCOUNTER — Inpatient Hospital Stay (HOSPITAL_COMMUNITY)
Admission: AD | Admit: 2018-11-28 | Discharge: 2018-11-28 | Discharge status: Against Medical Advice | Payer: Medicaid Other | Attending: Obstetrics and Gynecology | Admitting: Obstetrics and Gynecology

## 2018-11-28 DIAGNOSIS — N939 Abnormal uterine and vaginal bleeding, unspecified: Secondary | ICD-10-CM | POA: Diagnosis present

## 2018-11-28 DIAGNOSIS — O209 Hemorrhage in early pregnancy, unspecified: Secondary | ICD-10-CM

## 2018-11-28 DIAGNOSIS — O26891 Other specified pregnancy related conditions, first trimester: Secondary | ICD-10-CM | POA: Insufficient documentation

## 2018-11-28 DIAGNOSIS — Z5329 Procedure and treatment not carried out because of patient's decision for other reasons: Secondary | ICD-10-CM | POA: Insufficient documentation

## 2018-11-28 DIAGNOSIS — O208 Other hemorrhage in early pregnancy: Secondary | ICD-10-CM | POA: Diagnosis not present

## 2018-11-28 DIAGNOSIS — Z6791 Unspecified blood type, Rh negative: Secondary | ICD-10-CM | POA: Diagnosis not present

## 2018-11-28 DIAGNOSIS — O360110 Maternal care for anti-D [Rh] antibodies, first trimester, not applicable or unspecified: Secondary | ICD-10-CM | POA: Diagnosis not present

## 2018-11-28 DIAGNOSIS — R109 Unspecified abdominal pain: Secondary | ICD-10-CM | POA: Insufficient documentation

## 2018-11-28 DIAGNOSIS — Z3A08 8 weeks gestation of pregnancy: Secondary | ICD-10-CM | POA: Insufficient documentation

## 2018-11-28 DIAGNOSIS — O4691 Antepartum hemorrhage, unspecified, first trimester: Secondary | ICD-10-CM | POA: Insufficient documentation

## 2018-11-28 DIAGNOSIS — O4690 Antepartum hemorrhage, unspecified, unspecified trimester: Secondary | ICD-10-CM

## 2018-11-28 LAB — CBC
HCT: 38.8 % (ref 36.0–46.0)
Hemoglobin: 12.7 g/dL (ref 12.0–15.0)
MCH: 26.6 pg (ref 26.0–34.0)
MCHC: 32.7 g/dL (ref 30.0–36.0)
MCV: 81.3 fL (ref 80.0–100.0)
Platelets: 226 10*3/uL (ref 150–400)
RBC: 4.77 MIL/uL (ref 3.87–5.11)
RDW: 14.2 % (ref 11.5–15.5)
WBC: 5.3 10*3/uL (ref 4.0–10.5)
nRBC: 0 % (ref 0.0–0.2)

## 2018-11-28 LAB — WET PREP, GENITAL
Sperm: NONE SEEN
Trich, Wet Prep: NONE SEEN
Yeast Wet Prep HPF POC: NONE SEEN

## 2018-11-28 LAB — URINALYSIS, ROUTINE W REFLEX MICROSCOPIC
Bilirubin Urine: NEGATIVE
Glucose, UA: NEGATIVE mg/dL
Hgb urine dipstick: NEGATIVE
Ketones, ur: NEGATIVE mg/dL
Leukocytes,Ua: NEGATIVE
Nitrite: NEGATIVE
Protein, ur: NEGATIVE mg/dL
Specific Gravity, Urine: >1.03 — ABNORMAL HIGH (ref 1.005–1.030)
pH: 5.5 (ref 5.0–8.0)

## 2018-11-28 LAB — RH IG WORKUP (INCLUDES ABO/RH)
ABO/RH(D): A NEG
Antibody Screen: NEGATIVE
Gestational Age(Wks): 8

## 2018-11-28 LAB — HCG, QUANTITATIVE, PREGNANCY: hCG, Beta Chain, Quant, S: 75411 m[IU]/mL — ABNORMAL HIGH (ref <5)

## 2018-11-28 LAB — POC URINE PREG, ED: Preg Test, Ur: POSITIVE — AB

## 2018-11-28 NOTE — MAU Note (Signed)
Pt presents to MAU from ED with c/o vaginal bleeding, she had +preg test in ED. She started having vaginal bleeding x 2 days ago, describes bleeding as light pink spotting with no clots. She has also had abdominal cramping since bleeding started. LMP- 10/02/2018

## 2018-11-28 NOTE — ED Triage Notes (Signed)
Pt in with c/o vaginal bleeding and slight menstrual cramps. States she is [redacted] wks pregnant

## 2018-11-28 NOTE — MAU Provider Note (Addendum)
History     CSN: [id number]  Arrival date and time: 11/28/18 [id number]   First Provider Initiated Contact with Patient 11/28/18 1034      Chief Complaint  Patient presents with  . Vaginal Bleeding  . [redacted] wks pregnant   [id number] @[redacted]w[redacted]d  presenting with VB. Reports light bleeding 2 days ago, none since. No recent IC. Also reports intermittent low abd cramping x2 days. Rates 3/10. Has not needed analgesics. Denies urinary sx. No GI sx except nausea. Reports white vaginal discharge x1 mo with malodor. [facility] but reports she was late for her appt this am so she came here.   OB History    Gravida  3   Para  1   Term  1   Preterm      AB  1   Living  1     SAB      TAB  1   Ectopic      Multiple  0   Live Births  1           Past Medical History:  Diagnosis Date  . ADHD (attention deficit hyperactivity disorder)   . Depression   . Headache(784.0)     History reviewed. No pertinent surgical history.  Family History  Problem Relation Age of Onset  . Migraines Mother   . Migraines Paternal Grandmother   . Migraines Brother   . Migraines Other        Maternal great-grandmother  . Migraines Other        Two maternal great aunts  . Aneurysm Other        Maternal great uncle    Social History   Tobacco Use  . Smoking status: Never Smoker  . Smokeless tobacco: Never Used  Substance Use Topics  . Alcohol use: No  . Drug use: No    Allergies: No Known Allergies  No medications prior to admission.    Review of Systems  Constitutional: Negative for fever.  Gastrointestinal: Positive for abdominal pain and nausea. Negative for constipation, diarrhea and vomiting.  Genitourinary: Positive for vaginal bleeding and vaginal discharge. Negative for dysuria, frequency, hematuria and urgency.  Musculoskeletal: Negative for back pain.   Physical Exam   Blood pressure 113/68, pulse 81, temperature 98.2 F (36.8 C), temperature  source Oral, resp. rate 18, height 5\' 5"  (1.651 m), weight 68.9 kg, last menstrual period 10/02/2018, SpO2 100 %, unknown if currently breastfeeding.  Physical Exam  Nursing note and vitals reviewed. Constitutional: She is oriented to person, place, and time. She appears well-developed and well-nourished. No distress.  HENT:  Head: Normocephalic and atraumatic.  Neck: Normal range of motion.  Cardiovascular: Normal rate.  Respiratory: Effort normal. No respiratory distress.  GI: Soft. She exhibits no distension and no mass. There is no abdominal tenderness. There is no rebound and no guarding.  Genitourinary:    Genitourinary Comments: External: no lesions or erythema Vagina: rugated, pink, moist, thick yellow discharge Uterus: + enlarged, anteverted, non tender, no CMT Adnexae: no masses, no tenderness left, no tenderness right Cervix closed, parous    Musculoskeletal: Normal range of motion.  Neurological: She is alert and oriented to person, place, and time.  Skin: Skin is warm and dry.  Psychiatric: She has a normal mood and affect.   Results for orders placed or performed during the hospital encounter of 11/28/18 (from the past 24 hour(s))  POC Urine Pregnancy, ED (not [facility])  Status: Abnormal   Collection Time: 11/28/18  9:35 AM  Result Value Ref Range   Preg Test, Ur POSITIVE (A) NEGATIVE  hCG, quantitative, pregnancy     Status: Abnormal   Collection Time: 11/28/18 10:10 AM  Result Value Ref Range   hCG, Beta Chain, Quant, S 75,411 (H) <5 mIU/mL  Urinalysis, Routine w reflex microscopic     Status: Abnormal   Collection Time: 11/28/18 10:41 AM  Result Value Ref Range   Color, Urine YELLOW YELLOW   APPearance CLOUDY (A) CLEAR   Specific Gravity, Urine >1.030 (H) 1.005 - 1.030   pH 5.5 5.0 - 8.0   Glucose, UA NEGATIVE NEGATIVE mg/dL   Hgb urine dipstick NEGATIVE NEGATIVE   Bilirubin Urine NEGATIVE NEGATIVE   Ketones, ur NEGATIVE NEGATIVE mg/dL   Protein, ur  NEGATIVE NEGATIVE mg/dL   Nitrite NEGATIVE NEGATIVE   Leukocytes,Ua NEGATIVE NEGATIVE  Wet prep, genital     Status: Abnormal   Collection Time: 11/28/18 10:43 AM  Result Value Ref Range   Yeast Wet Prep HPF POC NONE SEEN NONE SEEN   Trich, Wet Prep NONE SEEN NONE SEEN   Clue Cells Wet Prep HPF POC PRESENT (A) NONE SEEN   WBC, Wet Prep HPF POC MANY (A) NONE SEEN   Sperm NONE SEEN    No results found.  MAU Course  Procedures Orders Placed This Encounter  Procedures  . Wet prep, genital    Standing Status:   Standing    Number of Occurrences:   1    Order Specific Question:   Patient immune status    Answer:   Normal  . US OB LESS THAN 14 WEEKS WITH OB TRANSVAGINAL    Standing Status:   Standing    Number of Occurrences:   1    Order Specific Question:   Symptom/Reason for Exam    Answer:   Vaginal bleeding in pregnancy [705036]  . CBC    Standing Status:   Standing    Number of Occurrences:   1  . hCG, quantitative, pregnancy    Standing Status:   Standing    Number of Occurrences:   1  . Urinalysis, Routine w reflex microscopic    Standing Status:   Standing    Number of Occurrences:   1  . POC Urine Pregnancy, ED (not at [facility])    Standing Status:   Standing    Number of Occurrences:   1  . Rh IG workup (includes ABO/Rh)    Standing Status:   Standing    Number of Occurrences:   1    Order Specific Question:   Weeks of Gestation    Answer:   8   MDM Labs and [place] ordered. I was notified by RN after the pt left that she signed AMA papers and was gone.  Assessment and Plan   1. [redacted] weeks gestation of pregnancy   2. Vaginal bleeding in pregnancy   3. Vaginal bleeding affecting early pregnancy   4. Rh negative status during pregnancy in first trimester    Left AMA  Donette Larry, CNM 11/28/2018, 12:31 PM

## 2018-11-29 LAB — GC/CHLAMYDIA PROBE AMP (~~LOC~~) NOT AT ARMC
Chlamydia: NEGATIVE
Neisseria Gonorrhea: NEGATIVE
Trichomonas: NEGATIVE

## 2018-11-30 ENCOUNTER — Encounter (HOSPITAL_BASED_OUTPATIENT_CLINIC_OR_DEPARTMENT_OTHER): Payer: Self-pay | Admitting: Adult Health

## 2018-11-30 ENCOUNTER — Emergency Department (HOSPITAL_BASED_OUTPATIENT_CLINIC_OR_DEPARTMENT_OTHER)
Admission: EM | Admit: 2018-11-30 | Discharge: 2018-11-30 | Disposition: A | Discharge status: Home / Self Care | Payer: Medicaid Other | Attending: Emergency Medicine | Admitting: Emergency Medicine

## 2018-11-30 ENCOUNTER — Emergency Department (HOSPITAL_BASED_OUTPATIENT_CLINIC_OR_DEPARTMENT_OTHER): Payer: Medicaid Other

## 2018-11-30 ENCOUNTER — Other Ambulatory Visit: Payer: Self-pay

## 2018-11-30 DIAGNOSIS — O418X10 Other specified disorders of amniotic fluid and membranes, first trimester, not applicable or unspecified: Secondary | ICD-10-CM

## 2018-11-30 DIAGNOSIS — N938 Other specified abnormal uterine and vaginal bleeding: Secondary | ICD-10-CM | POA: Insufficient documentation

## 2018-11-30 DIAGNOSIS — O26851 Spotting complicating pregnancy, first trimester: Secondary | ICD-10-CM | POA: Diagnosis not present

## 2018-11-30 DIAGNOSIS — O208 Other hemorrhage in early pregnancy: Secondary | ICD-10-CM | POA: Insufficient documentation

## 2018-11-30 DIAGNOSIS — O4690 Antepartum hemorrhage, unspecified, unspecified trimester: Secondary | ICD-10-CM

## 2018-11-30 DIAGNOSIS — Z3A01 Less than 8 weeks gestation of pregnancy: Secondary | ICD-10-CM | POA: Diagnosis not present

## 2018-11-30 DIAGNOSIS — O468X1 Other antepartum hemorrhage, first trimester: Secondary | ICD-10-CM

## 2018-11-30 DIAGNOSIS — N939 Abnormal uterine and vaginal bleeding, unspecified: Secondary | ICD-10-CM

## 2018-11-30 LAB — URINALYSIS, ROUTINE W REFLEX MICROSCOPIC
Bilirubin Urine: NEGATIVE
Glucose, UA: NEGATIVE mg/dL
Ketones, ur: NEGATIVE mg/dL
Leukocytes,Ua: NEGATIVE
Nitrite: NEGATIVE
Protein, ur: NEGATIVE mg/dL
Specific Gravity, Urine: >1.03 — ABNORMAL HIGH (ref 1.005–1.030)
pH: 6 (ref 5.0–8.0)

## 2018-11-30 LAB — CBC
HCT: 37.4 % (ref 36.0–46.0)
Hemoglobin: 12 g/dL (ref 12.0–15.0)
MCH: 26.8 pg (ref 26.0–34.0)
MCHC: 32.1 g/dL (ref 30.0–36.0)
MCV: 83.5 fL (ref 80.0–100.0)
Platelets: 215 10*3/uL (ref 150–400)
RBC: 4.48 MIL/uL (ref 3.87–5.11)
RDW: 14.5 % (ref 11.5–15.5)
WBC: 5.8 10*3/uL (ref 4.0–10.5)
nRBC: 0 % (ref 0.0–0.2)

## 2018-11-30 LAB — URINALYSIS, MICROSCOPIC (REFLEX)

## 2018-11-30 LAB — HCG, QUANTITATIVE, PREGNANCY: hCG, Beta Chain, Quant, S: 104974 m[IU]/mL — ABNORMAL HIGH (ref <5)

## 2018-11-30 LAB — PREGNANCY, URINE: Preg Test, Ur: POSITIVE — AB

## 2018-11-30 NOTE — Discharge Instructions (Addendum)
Call your OB/GYN for further evaluation and management. Return to the emergency room if you develop severe worsening abdominal pain, or any new, worsening, or concerning symptoms.

## 2018-11-30 NOTE — ED Provider Notes (Signed)
[facility] EMERGENCY DEPARTMENT Provider Note   CSN: [id number] Arrival date & time: 11/30/18  1330    History   Chief Complaint Chief Complaint  Patient presents with   Vaginal Bleeding    HPI Laurie Domeautica M Dobosz is a 23 y.o. female presenting for evaluation of vaginal bleeding.  Patient states she is approximately [redacted] weeks pregnant.  5 days ago, she had lower abdominal cramping and started to have vaginal bleeding.  She was seen 2 days ago, but had to leave to pick up her child prior to completion of evaluation.  She did not have an ultrasound at that time.  She did have a pelvic exam, which was reassuring.  Patient states she has not had any abdominal cramping since the first day.  However, her vaginal bleeding has increased and worsened.  Denies fevers, chills, chest pain, cough, shortness of breath, nausea, vomiting, upper abdominal pain, urinary symptoms, abnormal bowel movements.  She has no other medical problems, takes no medications daily.  This is her second pregnancy.  She denies dizziness or lightheadedness, even upon standing.     HPI  Past Medical History:  Diagnosis Date   ADHD (attention deficit hyperactivity disorder)    Depression    Headache(784.0)     Patient Active Problem List   Diagnosis Date Noted   Normal labor 01/03/2018   Migraine with aura, without mention of intractable migraine without mention of status migrainosus 10/21/2013   Variants of migraine, not elsewhere classified, without mention of intractable migraine without mention of status migrainosus 10/21/2013    History reviewed. No pertinent surgical history.   OB History    Gravida  3   Para  1   Term  1   Preterm      AB  1   Living  1     SAB      TAB  1   Ectopic      Multiple  0   Live Births  1            Home Medications    Prior to Admission medications   Medication Sig Start Date End Date Taking? Authorizing Provider    HYDROcodone-acetaminophen (NORCO/VICODIN) 5-325 MG tablet Take 1 tablet by mouth every 4 (four) hours as needed for moderate pain or severe pain. 02/22/18   Gilda CreasePollina, Christopher J, MD    Family History Family History  Problem Relation Age of Onset   Migraines Mother    Migraines Paternal Grandmother    Migraines Brother    Migraines Other        Maternal great-grandmother   Migraines Other        Two maternal great aunts   Aneurysm Other        Maternal great uncle    Social History Social History   Tobacco Use   Smoking status: Never Smoker   Smokeless tobacco: Never Used  Substance Use Topics   Alcohol use: No   Drug use: No     Allergies   Patient has no known allergies.   Review of Systems Review of Systems  Genitourinary: Positive for vaginal bleeding.  All other systems reviewed and are negative.    Physical Exam Updated Vital Signs BP 115/71 (BP Location: Right Arm)    Pulse 66    Temp 98.8 F (37.1 C) (Oral)    Resp 16    LMP 10/02/2018 (Exact Date)    SpO2 100%   Physical Exam Vitals signs and  nursing note reviewed.  Constitutional:      General: She is not in acute distress.    Appearance: She is well-developed.     Comments: Laying in the bed in NAD  HENT:     Head: Normocephalic and atraumatic.  Eyes:     Conjunctiva/sclera: Conjunctivae normal.     Pupils: Pupils are equal, round, and reactive to light.  Neck:     Musculoskeletal: Normal range of motion and neck supple.  Cardiovascular:     Rate and Rhythm: Normal rate and regular rhythm.  Pulmonary:     Effort: Pulmonary effort is normal. No respiratory distress.     Breath sounds: Normal breath sounds. No wheezing.  Abdominal:     General: There is no distension.     Palpations: Abdomen is soft. There is no mass.     Tenderness: There is no abdominal tenderness. There is no guarding or rebound.     Comments: No ttp of the abd. soft without rigidity, guarding, or distention.  Negative rebound  Musculoskeletal: Normal range of motion.  Skin:    General: Skin is warm and dry.     Capillary Refill: Capillary refill takes less than 2 seconds.  Neurological:     Mental Status: She is alert and oriented to person, place, and time.      ED Treatments / Results  Labs (all labs ordered are listed, but only abnormal results are displayed) Labs Reviewed  URINALYSIS, ROUTINE W REFLEX MICROSCOPIC - Abnormal; Notable for the following components:      Result Value   APPearance CLOUDY (*)    Specific Gravity, Urine >1.030 (*)    Hgb urine dipstick LARGE (*)    All other components within normal limits  PREGNANCY, URINE - Abnormal; Notable for the following components:   Preg Test, Ur POSITIVE (*)    All other components within normal limits  URINALYSIS, MICROSCOPIC (REFLEX) - Abnormal; Notable for the following components:   Bacteria, UA MANY (*)    All other components within normal limits  HCG, QUANTITATIVE, PREGNANCY - Abnormal; Notable for the following components:   hCG, Beta Chain, Quant, S [id number] (*)    All other components within normal limits  CBC    EKG None  Radiology US Ob Comp < 14 Wks  Result Date: 11/30/2018 CLINICAL DATA:  First trimester of pregnancy, vaginal spotting. EXAM: OBSTETRIC <14 WK [place] AND TRANSVAGINAL OB US TECHNIQUE: Both transabdominal and transvaginal ultrasound examinations were performed for complete evaluation of the gestation as well as the maternal uterus, adnexal regions, and pelvic cul-de-sac. Transvaginal technique was performed to assess early pregnancy. COMPARISON:  None. FINDINGS: Intrauterine gestational sac: Single visualized. Yolk sac:  Visualized. Embryo:  Visualized. Cardiac Activity: Visualized. Heart Rate: 150 bpm CRL: 13.3 mm 7 w 4 d [place] EDC: July 15, 2019. Subchorionic hemorrhage:  Small subchronic hemorrhage is noted. Maternal uterus/adnexae: Ovaries are unremarkable. Trace free fluid is noted which most  likely is physiologic. IMPRESSION: Single live intrauterine gestation of 7 weeks 4 days. Small subchorionic hemorrhage is noted. Electronically Signed   By: Lupita Raider M.D.   On: 11/30/2018 16:52   US Ob Transvaginal  Result Date: 11/30/2018 CLINICAL DATA:  First trimester of pregnancy, vaginal spotting. EXAM: OBSTETRIC <14 WK [place] AND TRANSVAGINAL OB US TECHNIQUE: Both transabdominal and transvaginal ultrasound examinations were performed for complete evaluation of the gestation as well as the maternal uterus, adnexal regions, and pelvic cul-de-sac. Transvaginal technique was performed to assess  early pregnancy. COMPARISON:  None. FINDINGS: Intrauterine gestational sac: Single visualized. Yolk sac:  Visualized. Embryo:  Visualized. Cardiac Activity: Visualized. Heart Rate: 150 bpm CRL: 13.3 mm 7 w 4 d [place] EDC: July 15, 2019. Subchorionic hemorrhage:  Small subchronic hemorrhage is noted. Maternal uterus/adnexae: Ovaries are unremarkable. Trace free fluid is noted which most likely is physiologic. IMPRESSION: Single live intrauterine gestation of 7 weeks 4 days. Small subchorionic hemorrhage is noted. Electronically Signed   By: Lupita Raider M.D.   On: 11/30/2018 16:52    Procedures Procedures (including critical care time)  Medications Ordered in ED Medications - No data to display   Initial Impression / Assessment and Plan / ED Course  I have reviewed the triage vital signs and the nursing notes.  Pertinent labs & imaging results that were available during my care of the patient were reviewed by me and considered in my medical decision making (see chart for details).        Patient presenting for evaluation of continued vaginal bleeding in the setting of pregnancy.  Had a pelvic exam 2 days ago, I do not believe repeat pelvic exam today would be beneficial.  She has not had an ultrasound yet during this pregnancy.  Considering her lower abdominal cramping several days ago and her  worsening vaginal bleeding, will obtain ultrasound to rule out ectopic.  Ultrasound shows single live IUP and subchorionic hemorrhage. Cbc shows reassuring hbg. hcg increasing as expected.  Discussed findings with patient.  Discussed importance of follow-up with OB/GYN for regular prenatal care.  At this time, patient appears safe for discharge.  Return precautions given.  Patient states she understands agrees plan.  Final Clinical Impressions(s) / ED Diagnoses   Final diagnoses:  Vaginal bleeding  Vaginal bleeding in pregnancy  Subchorionic hemorrhage of placenta in first trimester, single or unspecified fetus    ED Discharge Orders    None       Alveria Apley, PA-C 11/30/18 1756    Cathren Laine, MD 12/01/18 [phone]

## 2018-11-30 NOTE — ED Triage Notes (Signed)
Pt reports vaginal bleeding for 5 days, she reprots that she is about [redacted] weeks pregnant. She is going through a few panty liners a day.

## 2019-01-02 DIAGNOSIS — N760 Acute vaginitis: Secondary | ICD-10-CM | POA: Diagnosis not present

## 2019-01-02 DIAGNOSIS — Z113 Encounter for screening for infections with a predominantly sexual mode of transmission: Secondary | ICD-10-CM | POA: Diagnosis not present

## 2019-01-02 DIAGNOSIS — O039 Complete or unspecified spontaneous abortion without complication: Secondary | ICD-10-CM | POA: Diagnosis not present

## 2019-01-02 DIAGNOSIS — R8271 Bacteriuria: Secondary | ICD-10-CM | POA: Diagnosis not present

## 2019-01-16 DIAGNOSIS — Z3201 Encounter for pregnancy test, result positive: Secondary | ICD-10-CM | POA: Diagnosis not present

## 2019-01-16 DIAGNOSIS — Z113 Encounter for screening for infections with a predominantly sexual mode of transmission: Secondary | ICD-10-CM | POA: Diagnosis not present

## 2019-01-16 DIAGNOSIS — N760 Acute vaginitis: Secondary | ICD-10-CM | POA: Diagnosis not present

## 2019-01-25 DIAGNOSIS — Z0000 Encounter for general adult medical examination without abnormal findings: Secondary | ICD-10-CM | POA: Diagnosis not present

## 2019-01-25 DIAGNOSIS — N760 Acute vaginitis: Secondary | ICD-10-CM | POA: Diagnosis not present

## 2019-02-12 DIAGNOSIS — N898 Other specified noninflammatory disorders of vagina: Secondary | ICD-10-CM | POA: Diagnosis not present

## 2019-02-12 DIAGNOSIS — Z791 Long term (current) use of non-steroidal anti-inflammatories (NSAID): Secondary | ICD-10-CM | POA: Diagnosis not present

## 2019-02-12 DIAGNOSIS — Z79899 Other long term (current) drug therapy: Secondary | ICD-10-CM | POA: Diagnosis not present

## 2019-02-12 DIAGNOSIS — Z202 Contact with and (suspected) exposure to infections with a predominantly sexual mode of transmission: Secondary | ICD-10-CM | POA: Diagnosis not present

## 2019-05-28 DIAGNOSIS — N925 Other specified irregular menstruation: Secondary | ICD-10-CM | POA: Diagnosis not present

## 2019-05-28 DIAGNOSIS — Z3682 Encounter for antenatal screening for nuchal translucency: Secondary | ICD-10-CM | POA: Diagnosis not present

## 2019-05-28 DIAGNOSIS — O360990 Maternal care for other rhesus isoimmunization, unspecified trimester, not applicable or unspecified: Secondary | ICD-10-CM | POA: Diagnosis not present

## 2019-05-28 DIAGNOSIS — Z3480 Encounter for supervision of other normal pregnancy, unspecified trimester: Secondary | ICD-10-CM | POA: Diagnosis not present

## 2019-05-28 DIAGNOSIS — Z113 Encounter for screening for infections with a predominantly sexual mode of transmission: Secondary | ICD-10-CM | POA: Diagnosis not present

## 2019-05-28 DIAGNOSIS — O039 Complete or unspecified spontaneous abortion without complication: Secondary | ICD-10-CM | POA: Diagnosis not present

## 2019-05-28 DIAGNOSIS — Z3201 Encounter for pregnancy test, result positive: Secondary | ICD-10-CM | POA: Diagnosis not present

## 2019-05-28 DIAGNOSIS — N760 Acute vaginitis: Secondary | ICD-10-CM | POA: Diagnosis not present

## 2019-05-28 DIAGNOSIS — O26849 Uterine size-date discrepancy, unspecified trimester: Secondary | ICD-10-CM | POA: Diagnosis not present

## 2019-12-14 ENCOUNTER — Emergency Department (HOSPITAL_COMMUNITY): Payer: Medicaid Other

## 2019-12-14 ENCOUNTER — Encounter (HOSPITAL_COMMUNITY): Payer: Self-pay

## 2019-12-14 ENCOUNTER — Other Ambulatory Visit: Payer: Self-pay

## 2019-12-14 ENCOUNTER — Emergency Department (HOSPITAL_COMMUNITY)
Admission: EM | Admit: 2019-12-14 | Discharge: 2019-12-14 | Disposition: A | Discharge status: Home / Self Care | Payer: Medicaid Other | Attending: Emergency Medicine | Admitting: Emergency Medicine

## 2019-12-14 DIAGNOSIS — S199XXA Unspecified injury of neck, initial encounter: Secondary | ICD-10-CM | POA: Diagnosis not present

## 2019-12-14 DIAGNOSIS — S060X0A Concussion without loss of consciousness, initial encounter: Secondary | ICD-10-CM | POA: Diagnosis not present

## 2019-12-14 DIAGNOSIS — Y929 Unspecified place or not applicable: Secondary | ICD-10-CM | POA: Insufficient documentation

## 2019-12-14 DIAGNOSIS — F909 Attention-deficit hyperactivity disorder, unspecified type: Secondary | ICD-10-CM | POA: Insufficient documentation

## 2019-12-14 DIAGNOSIS — Y999 Unspecified external cause status: Secondary | ICD-10-CM | POA: Insufficient documentation

## 2019-12-14 DIAGNOSIS — Y939 Activity, unspecified: Secondary | ICD-10-CM | POA: Diagnosis not present

## 2019-12-14 DIAGNOSIS — R519 Headache, unspecified: Secondary | ICD-10-CM | POA: Diagnosis present

## 2019-12-14 MED ORDER — METHOCARBAMOL 500 MG PO TABS: 500.0000 mg | ORAL_TABLET | Freq: Two times a day (BID) | ORAL | 0 refills | Status: DC
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
## 2019-12-14 NOTE — Discharge Instructions (Addendum)
Please continue to take up to 800 mg of ibuprofen (4 over-the-counter pills) up to 3 times a day.  Please do not take this medication for more than a week.  Return to the ER if you have increasing sleepiness, increasing confusion, nausea, vomiting, numbness, tingling, or weakness in your arms.  I have also provided a muscle relaxer for you to take, please take this medication at night and do not drink or drive on this medication as it can cause sleepiness.  Please also do not take this medication if you are breast-feeding.  I have provided the phone number for the [facility] sports medicine which is a local concussion clinic in the area if your symptoms do not improve.
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
## 2019-12-14 NOTE — ED Notes (Signed)
Patient verbalizes understanding of discharge instructions . Opportunity for questions and answers were provided . Armband removed by staff ,Pt discharged from ED. W/C  offered at D/C  and Declined W/C at D/C and was escorted to lobby by RN.  

## 2019-12-17 ENCOUNTER — Telehealth: Payer: Self-pay | Admitting: Physical Therapy

## 2019-12-17 NOTE — Telephone Encounter (Signed)
Called pt on her cell and was not able to LM.  Called her grandmother who is listed as another contact and her grandmother said that she would relay message to pt.  I advised grandmother that I was holding our 3:30 pm slot for today for the pt as we had an opening.  Grandmother verbalizes understanding and states that she'll be sure to have her grand daughter call.

## 2019-12-19 NOTE — Telephone Encounter (Signed)
Will you be able to help with this concussion visit or would it be best to reschedule to another time that Laurie Hansen is here?

## 2019-12-19 NOTE — Telephone Encounter (Signed)
I'm out of the office from Wed at 12 noon until the following Tuesday so you'll need to see if Smith's side can see her or she'll need to wait until after I get back

## 2019-12-19 NOTE — Telephone Encounter (Signed)
Patient returned call to schedule her concussion visit. She is scheduled for Wednesday, June 9th, 2021 at 1:30pm. If this time is not okay, let me know and I can call her to move it around.

## 2019-12-24 ENCOUNTER — Ambulatory Visit (INDEPENDENT_AMBULATORY_CARE_PROVIDER_SITE_OTHER): Payer: PRIVATE HEALTH INSURANCE | Admitting: Family Medicine

## 2019-12-24 ENCOUNTER — Other Ambulatory Visit: Payer: Self-pay

## 2019-12-24 ENCOUNTER — Encounter: Payer: Self-pay | Admitting: Family Medicine

## 2019-12-24 VITALS — BP 102/78 | HR 69 | Ht 65.0 in | Wt 148.2 lb

## 2019-12-24 DIAGNOSIS — S060X0A Concussion without loss of consciousness, initial encounter: Secondary | ICD-10-CM | POA: Diagnosis not present

## 2019-12-24 DIAGNOSIS — F909 Attention-deficit hyperactivity disorder, unspecified type: Secondary | ICD-10-CM | POA: Insufficient documentation

## 2019-12-24 HISTORY — DX: Attention-deficit hyperactivity disorder, unspecified type: F90.9

## 2019-12-24 MED ORDER — NORTRIPTYLINE HCL 25 MG PO CAPS: 25.0000 mg | ORAL_CAPSULE | Freq: Every evening | ORAL | 2 refills | Status: DC | PRN
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
## 2019-12-24 NOTE — Patient Instructions (Signed)
Thank you for coming in today. Take nortryptline at bedtime for headache prevention and for sleep.  Take 1-2 pills.  Let me know if you have a problem or it is not going well. Ok to use tylenol or ibuprofen for headache.   Recheck in 3 weeks or sooner if needed.  Let me know if I need to do a form or write a letter.  Ok to work if feeling ok.

## 2019-12-25 ENCOUNTER — Ambulatory Visit: Payer: Medicaid Other | Admitting: Family Medicine

## 2020-01-09 ENCOUNTER — Encounter (HOSPITAL_COMMUNITY): Payer: Self-pay

## 2020-01-09 ENCOUNTER — Other Ambulatory Visit: Payer: Self-pay

## 2020-01-09 ENCOUNTER — Ambulatory Visit (HOSPITAL_COMMUNITY)
Admission: EM | Admit: 2020-01-09 | Discharge: 2020-01-09 | Disposition: A | Discharge status: Home / Self Care | Payer: Medicaid Other | Attending: Emergency Medicine | Admitting: Emergency Medicine

## 2020-01-09 DIAGNOSIS — H6501 Acute serous otitis media, right ear: Secondary | ICD-10-CM | POA: Diagnosis not present

## 2020-01-09 MED ORDER — AMOXICILLIN 500 MG PO CAPS: 500.0000 mg | ORAL_CAPSULE | Freq: Three times a day (TID) | ORAL | 0 refills | Status: DC

## 2020-01-09 MED ORDER — LORATADINE 10 MG PO TABS: 10.0000 mg | ORAL_TABLET | Freq: Every day | ORAL | 0 refills | Status: DC

## 2020-01-09 NOTE — ED Triage Notes (Signed)
C/o ear fullness on the right side.

## 2020-01-09 NOTE — ED Provider Notes (Signed)
[place]    CSN: [id number] Arrival date & time: 01/09/20  1620      History   Chief Complaint Chief Complaint  Patient presents with  . Ear Fullness    HPI Laurie Hansen is a 24 y.o. female.   Fullness not able to hear out of the RT ear since yesterday. Denies any nasal drainage , no cough or congestion. Has used peroxide with no relief.      Past Medical History:  Diagnosis Date  . ADHD (attention deficit hyperactivity disorder)   . Attention deficit hyperactivity disorder (ADHD) 12/24/2019  . Depression   . GURKYHCW(237.6)     Patient Active Problem List   Diagnosis Date Noted  . Attention deficit hyperactivity disorder (ADHD) 12/24/2019  . Normal labor 01/03/2018  . Migraine with aura, without mention of intractable migraine without mention of status migrainosus 10/21/2013  . Variants of migraine, not elsewhere classified, without mention of intractable migraine without mention of status migrainosus 10/21/2013    History reviewed. No pertinent surgical history.  OB History    Gravida  3   Para  1   Term  1   Preterm      AB  1   Living  1     SAB      TAB  1   Ectopic      Multiple  0   Live Births  1            Home Medications    Prior to Admission medications   Medication Sig Start Date End Date Taking? Authorizing Provider  nortriptyline (PAMELOR) 25 MG capsule Take 1-2 capsules (25-50 mg total) by mouth at bedtime as needed for sleep (and headache prevention). 12/24/19   Gregor Hams, MD    Family History Family History  Problem Relation Age of Onset  . Migraines Mother   . Migraines Paternal Grandmother   . Migraines Brother   . Migraines Other        Maternal great-grandmother  . Migraines Other        Two maternal great aunts  . Aneurysm Other        Maternal great uncle    Social History Social History   Tobacco Use  . Smoking status: Never Smoker  . Smokeless tobacco: Never Used  Vaping Use   . Vaping Use: Never used  Substance Use Topics  . Alcohol use: No  . Drug use: No     Allergies   Patient has no known allergies.   Review of Systems Review of Systems  Constitutional: Negative.   HENT: Positive for ear pain.   Respiratory: Negative.   Cardiovascular: Negative.   Neurological: Negative.      Physical Exam Triage Vital Signs ED Triage Vitals  Enc Vitals Group     BP 01/09/20 1650 121/78     Pulse Rate 01/09/20 1650 76     Resp 01/09/20 1650 12     Temp 01/09/20 1650 98.8 F (37.1 C)     Temp src --      SpO2 01/09/20 1650 99 %     Weight --      Height --      Head Circumference --      Peak Flow --      Pain Score 01/09/20 1649 3     Pain Loc --      Pain Edu? --      Excl. in [place]? --  No data found.  Updated Vital Signs BP 121/78   Pulse 76   Temp 98.8 F (37.1 C)   Resp 12   LMP 12/20/2019 (Exact Date)   SpO2 99%   Visual Acuity Right Eye Distance:   Left Eye Distance:   Bilateral Distance:    Right Eye Near:   Left Eye Near:    Bilateral Near:     Physical Exam HENT:     Left Ear: There is impacted cerumen.     Ears:     Comments: Rt TM bulging erythema, inflammation     Nose: Nose normal.     Mouth/Throat:     Mouth: Mucous membranes are moist.  Eyes:     Pupils: Pupils are equal, round, and reactive to light.  Cardiovascular:     Rate and Rhythm: Normal rate.  Pulmonary:     Effort: Pulmonary effort is normal.  Musculoskeletal:     Cervical back: Normal range of motion.  Neurological:     General: No focal deficit present.     Mental Status: She is alert.      UC Treatments / Results  Labs (all labs ordered are listed, but only abnormal results are displayed) Labs Reviewed - No data to display  EKG   Radiology No results found.  Procedures Procedures (including critical care time)  Medications Ordered in UC Medications - No data to display  Initial Impression / Assessment and Plan / UC Course   I have reviewed the triage vital signs and the nursing notes.  Pertinent labs & imaging results that were available during my care of the patient were reviewed by me and considered in my medical decision making (see chart for details).    Avoid putting anything in ears Take full dose of abx  Take Claritin or zyrtec daily  Take motrin as needed every 6 hours  Final Clinical Impressions(s) / UC Diagnoses   Final diagnoses:  None   Discharge Instructions   None    ED Prescriptions    None     PDMP not reviewed this encounter.   Coralyn Mark, NP 01/09/20 1720

## 2020-01-09 NOTE — Discharge Instructions (Addendum)
Avoid putting anything in ears Take full dose of abx  Take Claritin or zyrtec daily  Take motrin as needed every 6 hours

## 2020-01-14 ENCOUNTER — Other Ambulatory Visit: Payer: Self-pay

## 2020-01-14 ENCOUNTER — Ambulatory Visit (INDEPENDENT_AMBULATORY_CARE_PROVIDER_SITE_OTHER): Payer: PRIVATE HEALTH INSURANCE | Admitting: Family Medicine

## 2020-01-14 ENCOUNTER — Encounter: Payer: Self-pay | Admitting: Family Medicine

## 2020-01-14 VITALS — BP 108/78 | HR 80 | Ht 65.0 in | Wt 150.4 lb

## 2020-01-14 DIAGNOSIS — F909 Attention-deficit hyperactivity disorder, unspecified type: Secondary | ICD-10-CM | POA: Diagnosis not present

## 2020-01-14 DIAGNOSIS — S060X0D Concussion without loss of consciousness, subsequent encounter: Secondary | ICD-10-CM

## 2020-01-14 MED ORDER — TRAZODONE HCL 50 MG PO TABS: 50.0000 mg | ORAL_TABLET | Freq: Every day | ORAL | 1 refills | Status: DC

## 2020-01-14 MED ORDER — AMPHETAMINE-DEXTROAMPHETAMINE 10 MG PO TABS: 10.0000 mg | ORAL_TABLET | Freq: Two times a day (BID) | ORAL | 0 refills | Status: DC
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
## 2020-01-14 NOTE — Patient Instructions (Signed)
Thank you for coming in today. STOP nortriptyline.  Start adderall twice daily once in the morning and at lunch.  Take trazodone at bedtime as needed for sleep.  Recheck in about 2 weeks.  Let me know if you are having problems sooner.   Amphetamine; Dextroamphetamine tablets What is this medicine? AMPHETAMINE; DEXTROAMPHETAMINE(am FET a meen; dex troe am FET a meen) is used to treat attention-deficit hyperactivity disorder (ADHD). It may also be used for narcolepsy. Federal law prohibits giving this medicine to any person other than the person for whom it was prescribed. Do not share this medicine with anyone else. This medicine may be used for other purposes; ask your health care provider or pharmacist if you have questions. COMMON BRAND NAME(S): Adderall What should I tell my health care provider before I take this medicine? They need to know if you have any of these conditions:  anxiety or panic attacks  circulation problems in fingers and toes  glaucoma  hardening or blockages of the arteries or heart blood vessels  heart disease or a heart defect  high blood pressure  history of a drug or alcohol abuse problem  history of stroke  kidney disease  liver disease  mental illness  seizures  suicidal thoughts, plans, or attempt; a previous suicide attempt by you or a family member  thyroid disease  Tourette's syndrome  an unusual or allergic reaction to dextroamphetamine, other amphetamines, other medicines, foods, dyes, or preservatives  pregnant or trying to get pregnant  breast-feeding How should I use this medicine? Take this medicine by mouth with a glass of water. Follow the directions on the prescription label. Take your doses at regular intervals. Do not take your medicine more often than directed. Do not suddenly stop your medicine. You must gradually reduce the dose or you may feel withdrawal effects. Ask your doctor or health care professional for  advice. Talk to your pediatrician regarding the use of this medicine in children. Special care may be needed. While this drug may be prescribed for children as young as 3 years for selected conditions, precautions do apply. Overdosage: If you think you have taken too much of this medicine contact a poison control center or emergency room at once. NOTE: This medicine is only for you. Do not share this medicine with others. What if I miss a dose? If you miss a dose, take it as soon as you can. If it is almost time for your next dose, take only that dose. Do not take double or extra doses. What may interact with this medicine? Do not take this medicine with any of the following medications:  MAOIs like Carbex, Eldepryl, Marplan, Nardil, and Parnate  other stimulant medicines for attention disorders This medicine may also interact with the following medications:  acetazolamide  ammonium chloride  antacids  ascorbic acid  atomoxetine  caffeine  certain medicines for blood pressure  certain medicines for depression, anxiety, or psychotic disturbances  certain medicines for seizures like carbamazepine, phenobarbital, phenytoin  certain medicines for stomach problems like cimetidine, ranitidine, famotidine, esomeprazole, omeprazole, lansoprazole, pantoprazole  lithium  medicines for colds and breathing difficulties  medicines for diabetes  medicines or dietary supplements for weight loss or to stay awake  methenamine  narcotic medicines for pain  quinidine  ritonavir  sodium bicarbonate  St. John's wort This list may not describe all possible interactions. Give your health care provider a list of all the medicines, herbs, non-prescription drugs, or dietary supplements you use.  Also tell them if you smoke, drink alcohol, or use illegal drugs. Some items may interact with your medicine. What should I watch for while using this medicine? Visit your doctor or health care  professional for regular checks on your progress. This prescription requires that you follow special procedures with your doctor and pharmacy. You will need to have a new written prescription from your doctor every time you need a refill. This medicine may affect your concentration, or hide signs of tiredness. Until you know how this medicine affects you, do not drive, ride a bicycle, use machinery, or do anything that needs mental alertness. Tell your doctor or health care professional if this medicine loses its effects, or if you feel you need to take more than the prescribed amount. Do not change the dosage without talking to your doctor or health care professional. Decreased appetite is a common side effect when starting this medicine. Eating small, frequent meals or snacks can help. Talk to your doctor if you continue to have poor eating habits. Height and weight growth of a child taking this medicine will be monitored closely. Do not take this medicine close to bedtime. It may prevent you from sleeping. If you are going to need surgery, a MRI, CT scan, or other procedure, tell your doctor that you are taking this medicine. You may need to stop taking this medicine before the procedure. Tell your doctor or healthcare professional right away if you notice unexplained wounds on your fingers and toes while taking this medicine. You should also tell your healthcare provider if you experience numbness or pain, changes in the skin color, or sensitivity to temperature in your fingers or toes. What side effects may I notice from receiving this medicine? Side effects that you should report to your doctor or health care professional as soon as possible:  allergic reactions like skin rash, itching or hives, swelling of the face, lips, or tongue  anxious  breathing problems  changes in emotions or moods  changes in vision  chest pain or chest tightness  fast, irregular heartbeat  fingers or toes  feel numb, cool, painful  hallucination, loss of contact with reality  high blood pressure  males: prolonged or painful erection  seizures  signs and symptoms of serotonin syndrome like confusion, increased sweating, fever, tremor, stiff muscles, diarrhea  signs and symptoms of a stroke like changes in vision; confusion; trouble speaking or understanding; severe headaches; sudden numbness or weakness of the face, arm or leg; trouble walking; dizziness; loss of balance or coordination  suicidal thoughts or other mood changes  uncontrollable head, mouth, neck, arm, or leg movements Side effects that usually do not require medical attention (report to your doctor or health care professional if they continue or are bothersome):  dry mouth  headache  irritability  loss of appetite  nausea  trouble sleeping  weight loss This list may not describe all possible side effects. Call your doctor for medical advice about side effects. You may report side effects to FDA at 1-800-FDA-1088. Where should I keep my medicine? Keep out of the reach of children. This medicine can be abused. Keep your medicine in a safe place to protect it from theft. Do not share this medicine with anyone. Selling or giving away this medicine is dangerous and against the law. Store at room temperature between 15 and 30 degrees C (59 and 86 degrees F). Keep container tightly closed. Throw away any unused medicine after the expiration date. Dispose  of properly. This medicine may cause accidental overdose and death if it is taken by other adults, children, or pets. Mix any unused medicine with a substance like cat litter or coffee grounds. Then throw the medicine away in a sealed container like a sealed bag or a coffee can with a lid. Do not use the medicine after the expiration date. NOTE: This sheet is a summary. It may not cover all possible information. If you have questions about this medicine, talk to your doctor,  pharmacist, or health care provider.  2020 [employer]vier/Gold Standard (2016-08-26 08:67:61)

## 2020-01-28 ENCOUNTER — Other Ambulatory Visit: Payer: Self-pay

## 2020-01-28 ENCOUNTER — Encounter: Payer: Self-pay | Admitting: Family Medicine

## 2020-01-28 ENCOUNTER — Ambulatory Visit (INDEPENDENT_AMBULATORY_CARE_PROVIDER_SITE_OTHER): Payer: PRIVATE HEALTH INSURANCE | Admitting: Family Medicine

## 2020-01-28 VITALS — BP 100/70 | HR 71 | Ht 65.0 in | Wt 147.0 lb

## 2020-01-28 DIAGNOSIS — F909 Attention-deficit hyperactivity disorder, unspecified type: Secondary | ICD-10-CM | POA: Diagnosis not present

## 2020-01-28 DIAGNOSIS — S060X0D Concussion without loss of consciousness, subsequent encounter: Secondary | ICD-10-CM | POA: Diagnosis not present

## 2020-01-28 MED ORDER — AMPHETAMINE-DEXTROAMPHETAMINE 10 MG PO TABS: 10.0000 mg | ORAL_TABLET | Freq: Two times a day (BID) | ORAL | 0 refills | Status: DC

## 2020-01-28 NOTE — Patient Instructions (Signed)
Thank you for coming in today. Plan to continue the Adderall.  Request a refill before you run out.  Recheck in 3 months.

## 2020-01-28 NOTE — Progress Notes (Signed)
Subjective:   I, Debbe Odea, am serving as a scribe for Dr. Clementeen Graham.  Chief Complaint: Laurie Hansen,  is a 24 y.o. female who presents for follow up Concussion Last seen by Dr. Denyse Amass on 01/14/2020. At that visit her main complaint was fogginess and difficulty with concentrating and attention. She had a preconcussion history of ADHD that was not managed with any medication. Since her concussion her ADHD type symptoms had worsened quite a bit. Also at the last visit she had failed management with trazodone and nortriptyline. We decided to start Adderall as her symptoms were behaving like ADHD and she did have an ADHD history. Since then she has felt much better and has had complete resolutions of her symptoms. She continues to take Adderall.   Injury date : 12/13/2019 Visit #: 3   History of Present Illness:    Concussion Self-Reported Symptom Score Symptoms rated on a scale 1-6, in last 24 hours   Headache: 0    Nausea: 0  Dizziness: 0  Vomiting: 0  Balance Difficulty: 0   Trouble Falling Asleep: 0  Fatigue: 0  Sleep Less Than Usual: 0  Daytime Drowsiness: 0  Sleep More Than Usual: 0  Photophobia: 0  Phonophobia: 0  Irritability: 0  Sadness:0  Numbness or Tingling: 0  Nervousness: 0  Feeling More Emotional: 0  Feeling Mentally Foggy:0  Feeling Slowed Down: 0  Memory Problems: 0  Difficulty Concentrating: 0  Visual Problems: 0   Total Symptom Score: 0/132 Previous Symptom Score: 72/132   Neck Pain: No  Tinnitus: No  Review of Systems: No fevers or chills    Review of History: ADHD  Objective:    Physical Examination Vitals:   01/28/20 1448  BP: 100/70  Pulse: 71  SpO2: 99%    Neuro: Normal coordination Psych: Alert and oriented normal speech thought process and affect.   Assessment and Plan   24 y.o. female with concussion unmasking ADHD symptoms. Fortunately great response to Adderall. Plan to continue Adderall 10 mg twice daily and  reassess in 3 months. We'll continue to refill medication until then. Advised patient that she may try to wean off of the Adderall in the next month or 2. If still doing great and wanted to continue Adderall will refer to either PCP or ADHD specialist for ongoing prescription.      Action/Discussion: Reviewed diagnosis, management options, expected outcomes, and the reasons for scheduled and emergent follow-up. Questions were adequately answered. Patient expressed verbal understanding and agreement with the following plan.     Patient Education:  Reviewed with patient the risks (i.e, a repeat concussion, post-concussion syndrome, second-impact syndrome) of returning to play prior to complete resolution, and thoroughly reviewed the signs and symptoms of concussion.Reviewed need for complete resolution of all symptoms, with rest AND exertion, prior to return to play.  Reviewed red flags for urgent medical evaluation: worsening symptoms, nausea/vomiting, intractable headache, musculoskeletal changes, focal neurological deficits.  [facility] Con[facility] Care Plan, which clearly outlines the plans stated above, was given to patient.   In addition to the time spent performing tests, I spent 20 min   Reviewed with patient the risks (i.e, a repeat concussion, post-concussion syndrome, second-impact syndrome) of returning to play prior to complete resolution, and thoroughly reviewed the signs and symptoms of      concussion. Reviewedf need for complete resolution of all symptoms, with rest AND exertion, prior to return to play.  Reviewed red flags for urgent medical  evaluation: worsening symptoms, nausea/vomiting, intractable headache, musculoskeletal changes, focal neurological deficits.  [facility] Concussion Care Plan, which clearly outlines the plans stated above, was given to patient   After Visit Summary printed out and provided to patient as appropriate.  The  above documentation has been reviewed and is accurate and complete Clementeen Graham

## 2020-02-19 DIAGNOSIS — Z124 Encounter for screening for malignant neoplasm of cervix: Secondary | ICD-10-CM | POA: Diagnosis not present

## 2020-02-19 DIAGNOSIS — Z113 Encounter for screening for infections with a predominantly sexual mode of transmission: Secondary | ICD-10-CM | POA: Diagnosis not present

## 2020-02-19 DIAGNOSIS — Z01419 Encounter for gynecological examination (general) (routine) without abnormal findings: Secondary | ICD-10-CM | POA: Diagnosis not present

## 2020-03-12 DIAGNOSIS — A5609 Other chlamydial infection of lower genitourinary tract: Secondary | ICD-10-CM | POA: Diagnosis not present

## 2020-03-12 DIAGNOSIS — O26849 Uterine size-date discrepancy, unspecified trimester: Secondary | ICD-10-CM | POA: Diagnosis not present

## 2020-03-12 DIAGNOSIS — Z3480 Encounter for supervision of other normal pregnancy, unspecified trimester: Secondary | ICD-10-CM | POA: Diagnosis not present

## 2020-04-02 ENCOUNTER — Encounter: Payer: Self-pay | Admitting: Family Medicine

## 2020-04-16 ENCOUNTER — Encounter: Payer: Self-pay | Admitting: Family Medicine

## 2020-04-17 ENCOUNTER — Encounter: Payer: Self-pay | Admitting: Family Medicine

## 2020-04-29 ENCOUNTER — Ambulatory Visit: Payer: PRIVATE HEALTH INSURANCE | Admitting: Family Medicine
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
## 2020-07-18 NOTE — L&D Delivery Note (Signed)
Laurie Hansen is a 25 y.o. female [phone] with IUP at [redacted]w[redacted]d admitted for spontaneous onset of labor.  She progressed without augmentation to complete and pushed 3 times to deliver.  Cord clamping delayed by several minutes then clamped by CNM and cut by FOB.    Delivery Note At 3:32 AM a viable female was delivered via Vaginal, Spontaneous (Presentation:  Transverse).  APGAR: 8, 9; weight pending.   Placenta status: spontaneous, intact.  Cord: 3 vessels with the following complications: n/a.  Anesthesia: lidocaine Episiotomy: n/a  Lacerations:  1st degree perineal Suture Repair: 3.0 vicryl Est. Blood Loss (mL):  300  Mom to postpartum.  Baby to Couplet care / Skin to Skin.  Rolm Bookbinder CNM 06/02/2021, 3:54 AM

## 2020-07-22 ENCOUNTER — Ambulatory Visit (HOSPITAL_COMMUNITY)
Admission: EM | Admit: 2020-07-22 | Discharge: 2020-07-22 | Disposition: A | Discharge status: Home / Self Care | Payer: Medicaid Other | Attending: Family Medicine | Admitting: Family Medicine

## 2020-07-22 ENCOUNTER — Encounter (HOSPITAL_COMMUNITY): Payer: Self-pay | Admitting: Emergency Medicine

## 2020-07-22 ENCOUNTER — Other Ambulatory Visit: Payer: Self-pay

## 2020-07-22 DIAGNOSIS — U071 COVID-19: Secondary | ICD-10-CM | POA: Insufficient documentation

## 2020-07-22 DIAGNOSIS — J069 Acute upper respiratory infection, unspecified: Secondary | ICD-10-CM

## 2020-07-22 MED ORDER — PROMETHAZINE-DM 6.25-15 MG/5ML PO SYRP: 5.0000 mL | ORAL_SOLUTION | Freq: Four times a day (QID) | ORAL | 0 refills | Status: DC | PRN

## 2020-07-22 NOTE — ED Provider Notes (Signed)
[facility]    CSN: [id number] Arrival date & time: 07/22/20  1853      History   Chief Complaint Chief Complaint  Patient presents with  . Headache  . Nasal Congestion    HPI Laurie Hansen is a 25 y.o. female.   Here today with 2 days of runny nose, diarrhea, headache, cough. Denies fever, chills, CP, SOB, body aches, abdominal pain, vomiting. So far not taking anything OTC for sxs. Sister sick with same sxs. Not UTD on COVID vaccines. Hx of allergic rhinitis not taking antihistamines.      Past Medical History:  Diagnosis Date  . ADHD (attention deficit hyperactivity disorder)   . Attention deficit hyperactivity disorder (ADHD) 12/24/2019  . Depression   . [id number](478.2)     Patient Active Problem List   Diagnosis Date Noted  . Attention deficit hyperactivity disorder (ADHD) 12/24/2019  . Normal labor 01/03/2018  . Migraine with aura, without mention of intractable migraine without mention of status migrainosus 10/21/2013  . Variants of migraine, not elsewhere classified, without mention of intractable migraine without mention of status migrainosus 10/21/2013    History reviewed. No pertinent surgical history.  OB History    Gravida  3   Para  1   Term  1   Preterm      AB  1   Living  1     SAB      IAB  1   Ectopic      Multiple  0   Live Births  1            Home Medications    Prior to Admission medications   Medication Sig Start Date End Date Taking? Authorizing Provider  promethazine-dextromethorphan (PROMETHAZINE-DM) 6.25-15 MG/5ML syrup Take 5 mLs by mouth 4 (four) times daily as needed for cough. 07/22/20  Yes Particia Nearing, PA-C  amoxicillin (AMOXIL) 500 MG capsule Take 1 capsule (500 mg total) by mouth 3 (three) times daily. Patient not taking: Reported on 01/28/2020 01/09/20   Coralyn Mark, NP  amphetamine-dextroamphetamine (ADDERALL) 10 MG tablet Take 1 tablet (10 mg total) by mouth 2 (two) times  daily with a meal. 01/28/20   Rodolph Bong, MD  loratadine (CLARITIN) 10 MG tablet Take 1 tablet (10 mg total) by mouth daily. 01/09/20   Coralyn Mark, NP  traZODone (DESYREL) 50 MG tablet Take 1 tablet (50 mg total) by mouth at bedtime. For sleep 01/14/20   Rodolph Bong, MD    Family History Family History  Problem Relation Age of Onset  . Migraines Mother   . Migraines Paternal Grandmother   . Migraines Brother   . Migraines Other        Maternal great-grandmother  . Migraines Other        Two maternal great aunts  . Aneurysm Other        Maternal great uncle    Social History Social History   Tobacco Use  . Smoking status: Never Smoker  . Smokeless tobacco: Never Used  Vaping Use  . Vaping Use: Never used  Substance Use Topics  . Alcohol use: No  . Drug use: No     Allergies   Patient has no known allergies.   Review of Systems Review of Systems PER HPI    Physical Exam Triage Vital Signs ED Triage Vitals  Enc Vitals Group     BP 07/22/20 1957 118/81     Pulse Rate 07/22/20  1957 80     Resp --      Temp 07/22/20 1957 97.8 F (36.6 C)     Temp Source 07/22/20 1957 Oral     SpO2 07/22/20 1957 100 %     Weight --      Height --      Head Circumference --      Peak Flow --      Pain Score 07/22/20 1954 0     Pain Loc --      Pain Edu? --      Excl. in New Home? --    No data found.  Updated Vital Signs BP 118/81 (BP Location: Right Arm)   Pulse 80   Temp 97.8 F (36.6 C) (Oral)   LMP 07/18/2020   SpO2 100%   Visual Acuity Right Eye Distance:   Left Eye Distance:   Bilateral Distance:    Right Eye Near:   Left Eye Near:    Bilateral Near:     Physical Exam Vitals and nursing note reviewed.  Constitutional:      Appearance: Normal appearance. She is not ill-appearing.  HENT:     Head: Atraumatic.     Right Ear: Tympanic membrane normal.     Left Ear: Tympanic membrane normal.     Nose: Rhinorrhea present.     Mouth/Throat:      Mouth: Mucous membranes are moist.     Pharynx: Posterior oropharyngeal erythema present.  Eyes:     Extraocular Movements: Extraocular movements intact.     Conjunctiva/sclera: Conjunctivae normal.  Cardiovascular:     Rate and Rhythm: Normal rate and regular rhythm.     Heart sounds: Normal heart sounds.  Pulmonary:     Effort: Pulmonary effort is normal.     Breath sounds: Normal breath sounds. No wheezing or rales.  Abdominal:     General: Bowel sounds are normal.     Palpations: Abdomen is soft.  Musculoskeletal:        General: Normal range of motion.     Cervical back: Normal range of motion and neck supple.  Skin:    General: Skin is warm and dry.  Neurological:     Mental Status: She is alert and oriented to person, place, and time.  Psychiatric:        Mood and Affect: Mood normal.        Thought Content: Thought content normal.        Judgment: Judgment normal.      UC Treatments / Results  Labs (all labs ordered are listed, but only abnormal results are displayed) Labs Reviewed  SARS CORONAVIRUS 2 (TAT 6-24 HRS)    EKG   Radiology No results found.  Procedures Procedures (including critical care time)  Medications Ordered in UC Medications - No data to display  Initial Impression / Assessment and Plan / UC Course  I have reviewed the triage vital signs and the nursing notes.  Pertinent labs & imaging results that were available during my care of the patient were reviewed by me and considered in my medical decision making (see chart for details).     Exam and vitals reassuring, COVID pcr pending, isolation and supportive home care reviewed. Phenergan DM sent for cough. Return precautions given.   Final Clinical Impressions(s) / UC Diagnoses   Final diagnoses:  Viral URI with cough   Discharge Instructions   None    ED Prescriptions    Medication Sig Dispense Auth. Provider  promethazine-dextromethorphan (PROMETHAZINE-DM) 6.25-15 MG/5ML  syrup Take 5 mLs by mouth 4 (four) times daily as needed for cough. 100 mL Particia Nearing, [place]     PDMP not reviewed this encounter.   Particia Nearing, [place] 07/22/20 2033

## 2020-07-22 NOTE — ED Triage Notes (Signed)
Headache, runny nose, sore throat started 2 days ago. Had diarrhea last night.

## 2020-07-23 LAB — SARS CORONAVIRUS 2 (TAT 6-24 HRS): SARS Coronavirus 2: POSITIVE — AB

## 2020-07-29 ENCOUNTER — Encounter (HOSPITAL_COMMUNITY): Payer: Self-pay

## 2020-07-29 ENCOUNTER — Other Ambulatory Visit: Payer: Self-pay

## 2020-07-29 ENCOUNTER — Ambulatory Visit (HOSPITAL_COMMUNITY)
Admission: EM | Admit: 2020-07-29 | Discharge: 2020-07-29 | Disposition: A | Discharge status: Home / Self Care | Payer: Medicaid Other | Attending: Urgent Care | Admitting: Urgent Care

## 2020-07-29 DIAGNOSIS — N898 Other specified noninflammatory disorders of vagina: Secondary | ICD-10-CM | POA: Diagnosis not present

## 2020-07-29 DIAGNOSIS — N760 Acute vaginitis: Secondary | ICD-10-CM | POA: Insufficient documentation

## 2020-07-29 LAB — POC URINE PREG, ED: Preg Test, Ur: NEGATIVE

## 2020-07-29 MED ORDER — FLUCONAZOLE 150 MG PO TABS: 150.0000 mg | ORAL_TABLET | ORAL | 0 refills | Status: DC

## 2020-07-29 MED ORDER — METRONIDAZOLE 500 MG PO TABS: 500.0000 mg | ORAL_TABLET | Freq: Two times a day (BID) | ORAL | 0 refills | Status: DC

## 2020-07-29 NOTE — ED Provider Notes (Signed)
[facility]   MRN: [id number] DOB: [birth year]  Subjective:   Laurie Hansen is a 25 y.o. female presenting for 2-day history of acute onset vaginal discharge.  Patient has a history of bacterial vaginosis and yeast vaginitis.  She is on oral birth control.  Has sex with 1 partner, does not use protection.  Would like to make sure it is not an STI.  No current facility-administered medications for this encounter.  Current Outpatient Medications:  .  amoxicillin (AMOXIL) 500 MG capsule, Take 1 capsule (500 mg total) by mouth 3 (three) times daily. (Patient not taking: Reported on 01/28/2020), Disp: 21 capsule, Rfl: 0 .  amphetamine-dextroamphetamine (ADDERALL) 10 MG tablet, Take 1 tablet (10 mg total) by mouth 2 (two) times daily with a meal., Disp: 60 tablet, Rfl: 0 .  loratadine (CLARITIN) 10 MG tablet, Take 1 tablet (10 mg total) by mouth daily., Disp: 30 tablet, Rfl: 0 .  promethazine-dextromethorphan (PROMETHAZINE-DM) 6.25-15 MG/5ML syrup, Take 5 mLs by mouth 4 (four) times daily as needed for cough., Disp: 100 mL, Rfl: 0 .  traZODone (DESYREL) 50 MG tablet, Take 1 tablet (50 mg total) by mouth at bedtime. For sleep, Disp: 30 tablet, Rfl: 1   No Known Allergies  Past Medical History:  Diagnosis Date  . ADHD (attention deficit hyperactivity disorder)   . Attention deficit hyperactivity disorder (ADHD) 12/24/2019  . Depression   . Headache(784.0)      History reviewed. No pertinent surgical history.  Family History  Problem Relation Age of Onset  . Migraines Mother   . Migraines Paternal Grandmother   . Migraines Brother   . Migraines Other        Maternal great-grandmother  . Migraines Other        Two maternal great aunts  . Aneurysm Other        Maternal great uncle    Social History   Tobacco Use  . Smoking status: Never Smoker  . Smokeless tobacco: Never Used  Vaping Use  . Vaping Use: Never used  Substance Use Topics  . Alcohol use: No   . Drug use: No    ROS   Objective:   Vitals: BP 113/69 (BP Location: Right Arm)   Pulse 72   Temp 98.2 F (36.8 C) (Oral)   Resp 18   LMP 07/18/2020   SpO2 97%   Physical Exam Constitutional:      General: She is not in acute distress.    Appearance: Normal appearance. She is well-developed. She is not ill-appearing.  HENT:     Head: Normocephalic and atraumatic.     Nose: Nose normal.     Mouth/Throat:     Mouth: Mucous membranes are moist.     Pharynx: Oropharynx is clear.  Eyes:     General: No scleral icterus.    Extraocular Movements: Extraocular movements intact.     Pupils: Pupils are equal, round, and reactive to light.  Cardiovascular:     Rate and Rhythm: Normal rate.  Pulmonary:     Effort: Pulmonary effort is normal.  Abdominal:     General: Bowel sounds are normal. There is no distension.     Palpations: Abdomen is soft. There is no mass.     Tenderness: There is no abdominal tenderness. There is no right CVA tenderness, left CVA tenderness, guarding or rebound.  Skin:    General: Skin is warm and dry.  Neurological:     General: No  focal deficit present.     Mental Status: She is alert and oriented to person, place, and time.  Psychiatric:        Mood and Affect: Mood normal.        Behavior: Behavior normal.        Thought Content: Thought content normal.        Judgment: Judgment normal.     Results for orders placed or performed during the hospital encounter of 07/29/20 (from the past 24 hour(s))  POC urine pregnancy     Status: None   Collection Time: 07/29/20  6:18 PM  Result Value Ref Range   Preg Test, Ur NEGATIVE NEGATIVE    Assessment and Plan :   PDMP not reviewed this encounter.  1. Acute vaginitis   2. Vaginal discharge    Will cover empirically for recurrent BV and yeast infection, labs pending we will treat as appropriate thereafter. Counseled patient on potential for adverse effects with medications  prescribed/recommended today, ER and return-to-clinic precautions discussed, patient verbalized understanding.     Wallis Bamberg, [place] 07/29/20 Rickey Primus

## 2020-07-29 NOTE — ED Triage Notes (Signed)
Pt presents with abnormal vaginal discharge X 2 days.

## 2020-07-30 LAB — CERVICOVAGINAL ANCILLARY ONLY
Bacterial Vaginitis (gardnerella): POSITIVE — AB
Candida Glabrata: NEGATIVE
Candida Vaginitis: NEGATIVE
Chlamydia: NEGATIVE
Comment: NEGATIVE
Comment: NEGATIVE
Comment: NEGATIVE
Comment: NEGATIVE
Comment: NEGATIVE
Comment: NORMAL
Neisseria Gonorrhea: NEGATIVE
Trichomonas: NEGATIVE

## 2020-11-05 ENCOUNTER — Other Ambulatory Visit: Payer: Self-pay

## 2020-11-05 ENCOUNTER — Ambulatory Visit (INDEPENDENT_AMBULATORY_CARE_PROVIDER_SITE_OTHER): Payer: PRIVATE HEALTH INSURANCE | Admitting: Nurse Practitioner

## 2020-11-05 ENCOUNTER — Encounter: Payer: Self-pay | Admitting: Family Medicine

## 2020-11-05 VITALS — BP 117/72 | HR 80 | Ht 65.0 in | Wt 150.0 lb

## 2020-11-05 DIAGNOSIS — O219 Vomiting of pregnancy, unspecified: Secondary | ICD-10-CM

## 2020-11-05 DIAGNOSIS — Z3201 Encounter for pregnancy test, result positive: Secondary | ICD-10-CM | POA: Diagnosis not present

## 2020-11-05 LAB — POCT PREGNANCY, URINE: Preg Test, Ur: POSITIVE — AB

## 2020-11-05 MED ORDER — DOXYLAMINE-PYRIDOXINE 10-10 MG PO TBEC: DELAYED_RELEASE_TABLET | ORAL | 2 refills | Status: DC

## 2020-11-05 MED ORDER — PROMETHAZINE HCL 25 MG PO TABS: 25.0000 mg | ORAL_TABLET | Freq: Four times a day (QID) | ORAL | 0 refills | Status: DC | PRN

## 2020-11-05 NOTE — Patient Instructions (Addendum)
Safe Medications in Pregnancy   Acne: Benzoyl Peroxide Salicylic Acid  Backache/Headache: Tylenol: 2 regular strength every 4 hours OR              2 Extra strength every 6 hours  Colds/Coughs/Allergies: Benadryl (alcohol free) 25 mg every 6 hours as needed Breath right strips Claritin Cepacol throat lozenges Chloraseptic throat spray Cold-Eeze- up to three times per day Cough drops, alcohol free Flonase  Guaifenesin Mucinex Robitussin DM (plain only, alcohol free) Saline nasal spray/drops Sudafed (pseudoephedrine) & Actifed ** use only after [redacted] weeks gestation and if you do not have high blood pressure Tylenol Vicks Vaporub Zinc lozenges Zyrtec   Constipation: Colace Ducolax suppositories Fleet enema Glycerin suppositories Metamucil Milk of magnesia Miralax Senokot Smooth move tea  Diarrhea: Kaopectate Imodium A-D  *NO pepto Bismol  Hemorrhoids: Anusol Anusol HC Preparation H Tucks  Indigestion: Tums Maalox Mylanta Zantac  Pepcid  Insomnia: Benadryl (alcohol free) 25mg  every 6 hours as needed Tylenol PM Unisom, no Gelcaps  Leg Cramps: Tums MagGel  Nausea/Vomiting:  Bonine Dramamine Emetrol Ginger extract Sea bands Meclizine  Nausea medication to take during pregnancy:  Unisom (doxylamine succinate 25 mg tablets) Take one tablet daily at bedtime. If symptoms are not adequately controlled, the dose can be increased to a maximum recommended dose of two tablets daily (1/2 tablet in the morning, 1/2 tablet mid-afternoon and one at bedtime). Vitamin B6 100mg  tablets. Take one tablet twice a day (up to 200 mg per day).  Skin Rashes: Aveeno products Benadryl cream or 25mg  every 6 hours as needed Calamine Lotion 1% cortisone cream  Yeast infection: Gyne-lotrimin 7 Monistat 7   **If taking multiple medications, please check labels to avoid duplicating the same active ingredients **take medication as directed on the label ** Do not  exceed 4000 mg of tylenol in 24 hours **Do not take medications that contain aspirin or ibuprofen    .html">  First Trimester of Pregnancy  The first trimester of pregnancy starts on the first day of your last menstrual period until the end of week 12. This is also called months 1 through 3 of pregnancy. Body changes during your first trimester Your body goes through many changes during pregnancy. The changes usually return to normal after your baby is born. Physical changes  You may gain or lose weight.  Your breasts may grow larger and hurt. The area around your nipples may get darker.  Dark spots or blotches may develop on your face.  You may have changes in your hair. Health changes  You may feel like you might vomit (nauseous), and you may vomit.  You may have heartburn.  You may have headaches.  You may have trouble pooping (constipation).  Your gums may bleed. Other changes  You may get tired easily.  You may pee (urinate) more often.  Your menstrual periods will stop.  You may not feel hungry.  You may want to eat certain kinds of food.  You may have changes in your emotions from day to day.  You may have more dreams. Follow these instructions at home: Medicines  Take over-the-counter and prescription medicines only as told by your doctor. Some medicines are not safe during pregnancy.  Take a prenatal vitamin that contains at least 600 micrograms (mcg) of folic acid. Eating and drinking  Eat healthy meals that include: ? Fresh fruits and vegetables. ? Whole grains. ? Good sources of protein, such as meat, eggs, or tofu. ? Low-fat dairy products.  Avoid raw meat and unpasteurized juice, milk, and cheese.  If you feel like you may vomit, or you vomit: ? Eat 4 or 5 small meals a day instead of 3 large meals. ? Try eating a few soda crackers. ? Drink liquids between meals instead of during meals.  You may  need to take these actions to prevent or treat trouble pooping: ? Drink enough fluids to keep your pee (urine) pale yellow. ? Eat foods that are high in fiber. These include beans, whole grains, and fresh fruits and vegetables. ? Limit foods that are high in fat and sugar. These include fried or sweet foods. Activity  Exercise only as told by your doctor. Most people can do their usual exercise routine during pregnancy.  Stop exercising if you have cramps or pain in your lower belly (abdomen) or low back.  Do not exercise if it is too hot or too humid, or if you are in a place of great height (high altitude).  Avoid heavy lifting.  If you choose to, you may have sex unless your doctor tells you not to. Relieving pain and discomfort  Wear a good support bra if your breasts are sore.  Rest with your legs raised (elevated) if you have leg cramps or low back pain.  If you have bulging veins (varicose veins) in your legs: ? Wear support hose as told by your doctor. ? Raise your feet for 15 minutes, 3-4 times a day. ? Limit salt in your food. Safety  Wear your seat belt at all times when you are in a car.  Talk with your doctor if someone is hurting you or yelling at you.  Talk with your doctor if you are feeling sad or have thoughts of hurting yourself. Lifestyle  Do not use hot tubs, steam rooms, or saunas.  Do not douche. Do not use tampons or scented sanitary pads.  Do not use herbal medicines, illegal drugs, or medicines that are not approved by your doctor. Do not drink alcohol.  Do not smoke or use any products that contain nicotine or tobacco. If you need help quitting, ask your doctor.  Avoid cat litter boxes and soil that is used by cats. These carry germs that can cause harm to the baby and can cause a loss of your baby by miscarriage or stillbirth. General instructions  Keep all follow-up visits. This is important.  Ask for help if you need counseling or if you  need help with nutrition. Your doctor can give you advice or tell you where to go for help.  Visit your dentist. At home, brush your teeth with a soft toothbrush. Floss gently.  Write down your questions. Take them to your prenatal visits. Where to find more information  American Pregnancy Association: americanpregnancy.org  [employer]: www.acog.org  Office on Women's Health: MightyReward.co.nz Contact a doctor if:  You are dizzy.  You have a fever.  You have mild cramps or pressure in your lower belly.  You have a nagging pain in your belly area.  You continue to feel like you may vomit, you vomit, or you have watery poop (diarrhea) for 24 hours or longer.  You have a bad-smelling fluid coming from your vagina.  You have pain when you pee.  You are exposed to a disease that spreads from person to person, such as chickenpox, measles, Zika virus, HIV, or hepatitis. Get help right away if:  You have spotting or bleeding from your  bleeding from your vagina.  You have very bad belly cramping or pain.  You have shortness of breath or chest pain.  You have any kind of injury, such as from a fall or a car crash.  You have new or increased pain, swelling, or redness in an arm or leg. Summary  The first trimester of pregnancy starts on the first day of your last menstrual period until the end of week 12 (months 1 through 3).  Eat 4 or 5 small meals a day instead of 3 large meals.  Do not smoke or use any products that contain nicotine or tobacco. If you need help quitting, ask your doctor.  Keep all follow-up visits. This information is not intended to replace advice given to you by your health care provider. Make sure you discuss any questions you have with your health care provider. Document Revised: 12/11/2019 Document Reviewed: 10/17/2019 Elsevier Patient Education  2021 [employer]vier Inc.  

## 2020-11-05 NOTE — Progress Notes (Signed)
Patient presents to office today for UPT. UPT +. Patient reports first positive home test in February/March. LMP 08/23/20 EDD 05/30/21 [redacted]w[redacted]d. She is currently taking PNV. Reports daily nausea and would like prescription. Patient denies significant medical/obstetic history & would like to come here for prenatal care. Nolene Bernheim will see patient since last visit with practice was 03/2016.  Chase Caller RN BSN 11/05/20

## 2020-11-05 NOTE — Progress Notes (Signed)
  History:  Laurie Hansen is a 25 y.o. [phone] who presents to clinic today with complaint of possible pregnancy.   Having some nausea throughout the day.  Plans prenatal care here.   Past Medical History:  Diagnosis Date  . ADHD (attention deficit hyperactivity disorder)   . Attention deficit hyperactivity disorder (ADHD) 12/24/2019  . Depression   . Headache(784.0)     No past surgical history on file.  The following portions of the patient's history were reviewed and updated as appropriate: allergies, current medications, past family history, past medical history, past social history, past surgical history and problem list.   Review of Systems:  Pertinent items noted in HPI and remainder of comprehensive ROS otherwise negative.  Objective:  Physical Exam BP 117/72   Pulse 80   Ht 5\' 5"  (1.651 m)   Wt 150 lb (68 kg)   BMI 24.96 kg/m  Physical Exam GENERAL: Well-developed, well-nourished female in no acute distress.  HEENT: Normocephalic, atraumatic.  LUNGS: Effort normal  HEART: Regular rate  SKIN: Warm, dry and without erythema  PSYCH: Normal mood and affect    Labs and Imaging Results for orders placed or performed in visit on 11/05/20 (from the past 24 hour(s))  Pregnancy, urine POC     Status: Abnormal   Collection Time: 11/05/20 10:50 AM  Result Value Ref Range   Preg Test, Ur POSITIVE (A) NEGATIVE    No results found.   Assessment & Plan:  1. Nausea and vomiting during pregnancy Reviewed how to take diclegis Reviewed name of her practice and make up of the practice Plans prenatal care at this location No further questions about the prgnancy at this time.  - Doxylamine-Pyridoxine (DICLEGIS) 10-10 MG TBEC; Take 2 tablets at bedtime and one in the morning and one in the afternoon as needed for nausea.  Dispense: 60 tablet; Refill: 2  2. Pregnancy test positive  - Prenatal MV & Min w/FA-DHA (PRENATAL GUMMIES PO); Take 1 tablet by mouth daily. -  Pregnancy, urine POC    Approximately 15 minutes of total time was spent with this patient on history taking, coordination of care, education and documentation.   11/07/20, NP 11/05/2020 2:57 PM

## 2020-11-23 ENCOUNTER — Telehealth (INDEPENDENT_AMBULATORY_CARE_PROVIDER_SITE_OTHER): Payer: PRIVATE HEALTH INSURANCE

## 2020-11-23 ENCOUNTER — Telehealth (INDEPENDENT_AMBULATORY_CARE_PROVIDER_SITE_OTHER): Payer: Medicaid Other

## 2020-11-23 DIAGNOSIS — Z3490 Encounter for supervision of normal pregnancy, unspecified, unspecified trimester: Secondary | ICD-10-CM | POA: Insufficient documentation

## 2020-11-23 DIAGNOSIS — Z3A00 Weeks of gestation of pregnancy not specified: Secondary | ICD-10-CM

## 2020-11-23 MED ORDER — BLOOD PRESSURE KIT DEVI: 1.0000 | Freq: Once | 0 refills | Status: AC

## 2020-11-23 NOTE — Progress Notes (Signed)
New OB Intake  I connected with  Tilden Dome on 11/23/20 at  3:15 PM EDT by MyChart video and verified that I am speaking with the correct person using two identifiers. Nurse is located at [facility] and pt is located at place of work.  I discussed the limitations, risks, security and privacy concerns of performing an evaluation and management service by telephone and the availability of in person appointments. I also discussed with the patient that there may be a patient responsible charge related to this service. The patient expressed understanding and agreed to proceed.  I explained I am completing New OB Intake today. We discussed her EDD of 05/30/21 that is based on LMP of 08/23/20. Pt is G4/P1. I reviewed her allergies, medications, Medical/Surgical/OB history, and appropriate screenings. I informed her of [facility] services. Based on history, this is an uncomplicated pregnancy.  Patient Active Problem List   Diagnosis Date Noted  . Supervision of low-risk pregnancy 11/23/2020  . Attention deficit hyperactivity disorder (ADHD) 12/24/2019  . Migraine with aura, without mention of intractable migraine without mention of status migrainosus 10/21/2013  . Variants of migraine, not elsewhere classified, without mention of intractable migraine without mention of status migrainosus 10/21/2013    Concerns addressed today  Patient would like to review genetic screening questions with FOB during new OB visit.   Delivery Plans:  Plans to deliver at [facility].   MyChart/Babyscripts MyChart access verified. I explained pt will have some visits in office and some virtually. Babyscripts instructions given and order placed. Patient verifies receipt of registration text/e-mail.   Blood Pressure Cuff Blood pressure cuff ordered for patient to pick-up from [employer]. Explained after first prenatal appt pt will check weekly and document in Babyscripts.  Anatomy US Explained first scheduled [place] will be  around 19 weeks. Anatomy US scheduled for 01/04/21 at 0815. Pt notified to arrive at 0800.  Labs Discussed Avelina Laine genetic screening with patient. Would like both Panorama and Horizon drawn at new OB visit. Routine prenatal labs needed.  COVID Vaccine Patient has had COVID vaccine.   Social Determinants of Health . Food Insecurity: Patient denies food insecurity. . WIC Referral: Patient is enrolled in [facility].  . Transportation: Patient denies transportation needs. . Childcare: Discussed no children allowed at ultrasound appointments. Offered childcare services; patient declines childcare services at this time.  First visit review I reviewed new OB appt with pt. I explained visit with provider will include a pelvic exam with PAP smear and ob blood work. Explained pt will be seen by Casper Harrison, MD at first visit; encounter routed to appropriate provider. Explained that patient will be seen by pregnancy navigator following visit with provider.  Marjo Bicker, RN 11/23/2020  3:15 PM

## 2020-11-23 NOTE — Progress Notes (Signed)
New OB Intake  I connected with  Laurie Hansen on 11/23/20 at  8:15 AM EDT by MyChart video and verified that I am speaking with the correct person using two identifiers. Nurse is located at [facility] and pt is located at home.  Pt states she is leaving home to drop daughter off at daycare and will be driving. I explained I will not be able to complete visit while patient is driving. Pt agreeable to reschedule for 3:15 PM today. States she will have 30 minutes available to complete appointment at that time.   Marjo Bicker, RN 11/23/2020  8:03 AM

## 2020-11-24 ENCOUNTER — Encounter: Payer: Self-pay | Admitting: General Practice

## 2020-11-24 ENCOUNTER — Ambulatory Visit (INDEPENDENT_AMBULATORY_CARE_PROVIDER_SITE_OTHER): Payer: Medicaid Other | Admitting: Obstetrics and Gynecology

## 2020-11-24 ENCOUNTER — Other Ambulatory Visit: Payer: Self-pay

## 2020-11-24 ENCOUNTER — Other Ambulatory Visit (HOSPITAL_COMMUNITY)
Admission: RE | Admit: 2020-11-24 | Discharge: 2020-11-24 | Disposition: A | Discharge status: Home / Self Care | Payer: Medicaid Other | Source: Ambulatory Visit | Attending: Obstetrics and Gynecology | Admitting: Obstetrics and Gynecology

## 2020-11-24 ENCOUNTER — Encounter: Payer: Self-pay | Admitting: Family Medicine

## 2020-11-24 VITALS — BP 117/74 | HR 87 | Wt 154.0 lb

## 2020-11-24 DIAGNOSIS — O26891 Other specified pregnancy related conditions, first trimester: Secondary | ICD-10-CM

## 2020-11-24 DIAGNOSIS — Z3A13 13 weeks gestation of pregnancy: Secondary | ICD-10-CM | POA: Diagnosis not present

## 2020-11-24 DIAGNOSIS — Z31430 Encounter of female for testing for genetic disease carrier status for procreative management: Secondary | ICD-10-CM | POA: Diagnosis not present

## 2020-11-24 DIAGNOSIS — Z3492 Encounter for supervision of normal pregnancy, unspecified, second trimester: Secondary | ICD-10-CM

## 2020-11-24 DIAGNOSIS — Z3482 Encounter for supervision of other normal pregnancy, second trimester: Secondary | ICD-10-CM | POA: Diagnosis not present

## 2020-11-24 DIAGNOSIS — Z6791 Unspecified blood type, Rh negative: Secondary | ICD-10-CM

## 2020-11-24 LAB — POCT URINALYSIS DIP (DEVICE)
Bilirubin Urine: NEGATIVE
Glucose, UA: NEGATIVE mg/dL
Hgb urine dipstick: NEGATIVE
Ketones, ur: NEGATIVE mg/dL
Leukocytes,Ua: NEGATIVE
Nitrite: NEGATIVE
Protein, ur: NEGATIVE mg/dL
Specific Gravity, Urine: 1.02 (ref 1.005–1.030)
Urobilinogen, UA: 0.2 mg/dL (ref 0.0–1.0)
pH: 7.5 (ref 5.0–8.0)

## 2020-11-24 NOTE — Patient Instructions (Signed)
https://www.cdc.gov/pregnancy/infections.html">  First Trimester of Pregnancy  The first trimester of pregnancy starts on the first day of your last menstrual period until the end of week 12. This is also called months 1 through 3 of pregnancy. Body changes during your first trimester Your body goes through many changes during pregnancy. The changes usually return to normal after your baby is born. Physical changes  You may gain or lose weight.  Your breasts may grow larger and hurt. The area around your nipples may get darker.  Dark spots or blotches may develop on your face.  You may have changes in your hair. Health changes  You may feel like you might vomit (nauseous), and you may vomit.  You may have heartburn.  You may have headaches.  You may have trouble pooping (constipation).  Your gums may bleed. Other changes  You may get tired easily.  You may pee (urinate) more often.  Your menstrual periods will stop.  You may not feel hungry.  You may want to eat certain kinds of food.  You may have changes in your emotions from day to day.  You may have more dreams. Follow these instructions at home: Medicines  Take over-the-counter and prescription medicines only as told by your doctor. Some medicines are not safe during pregnancy.  Take a prenatal vitamin that contains at least 600 micrograms (mcg) of folic acid. Eating and drinking  Eat healthy meals that include: ? Fresh fruits and vegetables. ? Whole grains. ? Good sources of protein, such as meat, eggs, or tofu. ? Low-fat dairy products.  Avoid raw meat and unpasteurized juice, milk, and cheese.  If you feel like you may vomit, or you vomit: ? Eat 4 or 5 small meals a day instead of 3 large meals. ? Try eating a few soda crackers. ? Drink liquids between meals instead of during meals.  You may need to take these actions to prevent or treat trouble pooping: ? Drink enough fluids to keep your pee  (urine) pale yellow. ? Eat foods that are high in fiber. These include beans, whole grains, and fresh fruits and vegetables. ? Limit foods that are high in fat and sugar. These include fried or sweet foods. Activity  Exercise only as told by your doctor. Most people can do their usual exercise routine during pregnancy.  Stop exercising if you have cramps or pain in your lower belly (abdomen) or low back.  Do not exercise if it is too hot or too humid, or if you are in a place of great height (high altitude).  Avoid heavy lifting.  If you choose to, you may have sex unless your doctor tells you not to. Relieving pain and discomfort  Wear a good support bra if your breasts are sore.  Rest with your legs raised (elevated) if you have leg cramps or low back pain.  If you have bulging veins (varicose veins) in your legs: ? Wear support hose as told by your doctor. ? Raise your feet for 15 minutes, 3-4 times a day. ? Limit salt in your food. Safety  Wear your seat belt at all times when you are in a car.  Talk with your doctor if someone is hurting you or yelling at you.  Talk with your doctor if you are feeling sad or have thoughts of hurting yourself. Lifestyle  Do not use hot tubs, steam rooms, or saunas.  Do not douche. Do not use tampons or scented sanitary pads.  Do not   use herbal medicines, illegal drugs, or medicines that are not approved by your doctor. Do not drink alcohol.  Do not smoke or use any products that contain nicotine or tobacco. If you need help quitting, ask your doctor.  Avoid cat litter boxes and soil that is used by cats. These carry germs that can cause harm to the baby and can cause a loss of your baby by miscarriage or stillbirth. General instructions  Keep all follow-up visits. This is important.  Ask for help if you need counseling or if you need help with nutrition. Your doctor can give you advice or tell you where to go for help.  Visit your  dentist. At home, brush your teeth with a soft toothbrush. Floss gently.  Write down your questions. Take them to your prenatal visits. Where to find more information  American Pregnancy Association: americanpregnancy.org  American College of Obstetricians and Gynecologists: www.acog.org  Office on Women's Health: womenshealth.gov/pregnancy Contact a doctor if:  You are dizzy.  You have a fever.  You have mild cramps or pressure in your lower belly.  You have a nagging pain in your belly area.  You continue to feel like you may vomit, you vomit, or you have watery poop (diarrhea) for 24 hours or longer.  You have a bad-smelling fluid coming from your vagina.  You have pain when you pee.  You are exposed to a disease that spreads from person to person, such as chickenpox, measles, Zika virus, HIV, or hepatitis. Get help right away if:  You have spotting or bleeding from your vagina.  You have very bad belly cramping or pain.  You have shortness of breath or chest pain.  You have any kind of injury, such as from a fall or a car crash.  You have new or increased pain, swelling, or redness in an arm or leg. Summary  The first trimester of pregnancy starts on the first day of your last menstrual period until the end of week 12 (months 1 through 3).  Eat 4 or 5 small meals a day instead of 3 large meals.  Do not smoke or use any products that contain nicotine or tobacco. If you need help quitting, ask your doctor.  Keep all follow-up visits. This information is not intended to replace advice given to you by your health care provider. Make sure you discuss any questions you have with your health care provider. Document Revised: 12/11/2019 Document Reviewed: 10/17/2019 Elsevier Patient Education  2021 Elsevier Inc.  

## 2020-11-24 NOTE — Progress Notes (Signed)
  Subjective:    Laurie Hansen is a [id number] [redacted]w[redacted]d being seen today for her first obstetrical visit.  Her obstetrical history is significant for hx gbs in prior pregnancy. Patient does intend to breast feed. Pregnancy history fully reviewed.  Patient reports no complaints.  Last baby born 2019, NSVD. Uncomplicated. GBS pos received abx.    Vitals:   11/24/20 0911  BP: 117/74  Pulse: 87  Weight: 154 lb (69.9 kg)    HISTORY: OB History  Gravida Para Term Preterm AB Living  4 1 1  0 2 1  SAB IAB Ectopic Multiple Live Births  0 2 0 0 1    # Outcome Date GA Lbr Len/2nd Weight Sex Delivery Anes PTL Lv  4 Current           3 IAB 2021          2 Term 01/04/18 [redacted]w[redacted]d / 00:58 7 lb 12.7 oz (3.535 kg) F Vag-Spont EPI  LIV  1 IAB 02/11/16 [redacted]w[redacted]d          Past Medical History:  Diagnosis Date  . ADHD (attention deficit hyperactivity disorder)   . Attention deficit hyperactivity disorder (ADHD) 12/24/2019  . Depression   . Headache(784.0)    No past surgical history on file. Family History  Problem Relation Age of Onset  . Migraines Mother   . Cancer Mother   . Lupus Mother   . Hypertension Maternal Grandmother   . COPD Maternal Grandmother   . Asthma Maternal Grandmother   . Migraines Paternal Grandmother   . Diabetes Paternal Grandmother   . Migraines Brother   . Migraines Other        Maternal great-grandmother  . Migraines Other        Two maternal great aunts  . Aneurysm Other        Maternal great uncle     Exam    Uterus:     Pelvic Exam:    Perineum: No Hemorrhoids   Vulva: normal   Vagina:  normal mucosa   Cervix: multiparous appearance   Adnexa: normal adnexa   Skin: normal coloration and turgor, no rashes    Neurologic: oriented, normal   Extremities: normal strength, tone, and muscle mass   HEENT PERRLA   Neck supple   Cardiovascular: regular rate and rhythm   Respiratory:  appears well, vitals normal, no respiratory distress, acyanotic, normal RR,  ear and throat exam is normal, neck free of mass or lymphadenopathy, chest clear, no wheezing, crepitations, rhonchi, normal symmetric air entry   Abdomen: soft, non-tender; bowel sounds normal; no masses,  no organomegaly   Urinary: urethral meatus normal      Assessment:    Pregnancy: 02/23/2020 Patient Active Problem List   Diagnosis Date Noted  . Supervision of low-risk pregnancy 11/23/2020  . Attention deficit hyperactivity disorder (ADHD) 12/24/2019  . Migraine with aura, without mention of intractable migraine without mention of status migrainosus 10/21/2013  . Variants of migraine, not elsewhere classified, without mention of intractable migraine without mention of status migrainosus 10/21/2013        Plan:    Supervision of normal pregnancy  Initial labs drawn. Pap smear completed.  Oriented to clinic and the presence of multiple providers, including physicians, APPS, residents, students.  Prenatal vitamins. Problem list reviewed and updated. Genetic Screening discussed Integrated Screen: requested.  Ultrasound discussed; fetal survey: requested.  Follow up in 4 weeks.  12/21/2013 11/24/2020

## 2020-11-25 LAB — CBC
Hematocrit: 38.2 % (ref 34.0–46.6)
Hemoglobin: 12.4 g/dL (ref 11.1–15.9)
MCH: 27.6 pg (ref 26.6–33.0)
MCHC: 32.5 g/dL (ref 31.5–35.7)
MCV: 85 fL (ref 79–97)
Platelets: 215 10*3/uL (ref 150–450)
RBC: 4.5 x10E6/uL (ref 3.77–5.28)
RDW: 14 % (ref 11.7–15.4)
WBC: 6.5 10*3/uL (ref 3.4–10.8)

## 2020-11-25 LAB — HIV ANTIBODY (ROUTINE TESTING W REFLEX): HIV Screen 4th Generation wRfx: NONREACTIVE

## 2020-11-25 LAB — CERVICOVAGINAL ANCILLARY ONLY
Bacterial Vaginitis (gardnerella): POSITIVE — AB
Candida Glabrata: NEGATIVE
Candida Vaginitis: NEGATIVE
Chlamydia: NEGATIVE
Comment: NEGATIVE
Comment: NEGATIVE
Comment: NEGATIVE
Comment: NEGATIVE
Comment: NEGATIVE
Comment: NORMAL
Neisseria Gonorrhea: NEGATIVE
Trichomonas: NEGATIVE

## 2020-11-25 LAB — HEMOGLOBIN A1C
Est. average glucose Bld gHb Est-mCnc: 100 mg/dL
Hgb A1c MFr Bld: 5.1 % (ref 4.8–5.6)

## 2020-11-25 LAB — CYTOLOGY - PAP: Diagnosis: NEGATIVE

## 2020-11-25 LAB — HEPATITIS B SURFACE ANTIGEN: Hepatitis B Surface Ag: NEGATIVE

## 2020-11-25 LAB — HEPATITIS C ANTIBODY: Hep C Virus Ab: <0.1 s/co ratio (ref 0.0–0.9)

## 2020-11-25 LAB — RUBELLA SCREEN: Rubella Antibodies, IGG: 2.62 index (ref >0.99)

## 2020-11-25 LAB — RPR: RPR Ser Ql: NONREACTIVE

## 2020-11-26 ENCOUNTER — Other Ambulatory Visit: Payer: Self-pay | Admitting: Lactation Services

## 2020-11-26 MED ORDER — METRONIDAZOLE 500 MG PO TABS: 500.0000 mg | ORAL_TABLET | Freq: Two times a day (BID) | ORAL | 0 refills | Status: DC

## 2020-11-27 LAB — CULTURE, OB URINE

## 2020-11-27 LAB — URINE CULTURE, OB REFLEX

## 2020-11-30 ENCOUNTER — Other Ambulatory Visit: Payer: Self-pay | Admitting: Lactation Services

## 2020-11-30 MED ORDER — TINIDAZOLE 500 MG PO TABS: 2.0000 g | ORAL_TABLET | Freq: Every day | ORAL | 0 refills | Status: DC

## 2020-11-30 NOTE — Progress Notes (Signed)
Ordered Tindamax as patient is not able to tolerate oral Flagyl per standing order

## 2020-12-01 ENCOUNTER — Other Ambulatory Visit: Payer: Self-pay | Admitting: Obstetrics and Gynecology

## 2020-12-01 MED ORDER — CEPHALEXIN 250 MG PO CAPS: 250.0000 mg | ORAL_CAPSULE | Freq: Three times a day (TID) | ORAL | 0 refills | Status: AC

## 2020-12-01 NOTE — Progress Notes (Signed)
Patient growing Klebsiella in her urine. Script sent for Keflex TID for ten days.     Casper Harrison, MD [facility] Family Medicine Fellow, [facility] for [facility], [facility]

## 2020-12-02 ENCOUNTER — Encounter: Payer: Self-pay | Admitting: Family Medicine

## 2020-12-02 NOTE — Progress Notes (Signed)
Called patient and informed her that she has a UTI and a prescription for ATB has been called in to her Pharmacy. Advised that patient need to complete entire course of medication. Patient asked about treatment for BV and if PA has been completed for Tindamax prescription.

## 2020-12-03 ENCOUNTER — Encounter: Payer: Self-pay | Admitting: General Practice

## 2020-12-09 ENCOUNTER — Encounter: Payer: Self-pay | Admitting: *Deleted

## 2021-01-04 ENCOUNTER — Ambulatory Visit: Payer: Medicaid Other | Attending: Obstetrics and Gynecology

## 2021-01-04 ENCOUNTER — Other Ambulatory Visit: Payer: Self-pay

## 2021-01-04 ENCOUNTER — Other Ambulatory Visit: Payer: Self-pay | Admitting: *Deleted

## 2021-01-04 DIAGNOSIS — Z3490 Encounter for supervision of normal pregnancy, unspecified, unspecified trimester: Secondary | ICD-10-CM | POA: Diagnosis not present

## 2021-01-04 DIAGNOSIS — Z362 Encounter for other antenatal screening follow-up: Secondary | ICD-10-CM

## 2021-01-09 ENCOUNTER — Encounter (HOSPITAL_COMMUNITY): Payer: Self-pay | Admitting: Emergency Medicine

## 2021-01-09 ENCOUNTER — Other Ambulatory Visit: Payer: Self-pay

## 2021-01-09 ENCOUNTER — Ambulatory Visit (HOSPITAL_COMMUNITY)
Admission: EM | Admit: 2021-01-09 | Discharge: 2021-01-09 | Disposition: A | Discharge status: Other Acute Inpatient Hospital | Payer: Medicaid Other | Attending: Clinical | Admitting: Clinical

## 2021-01-09 ENCOUNTER — Emergency Department (HOSPITAL_COMMUNITY)
Admission: EM | Admit: 2021-01-09 | Discharge: 2021-01-10 | Disposition: A | Discharge status: Home / Self Care | Payer: Medicaid Other | Attending: Emergency Medicine | Admitting: Emergency Medicine

## 2021-01-09 DIAGNOSIS — R454 Irritability and anger: Secondary | ICD-10-CM | POA: Diagnosis not present

## 2021-01-09 DIAGNOSIS — Y900 Blood alcohol level of less than 20 mg/100 ml: Secondary | ICD-10-CM | POA: Diagnosis not present

## 2021-01-09 DIAGNOSIS — F331 Major depressive disorder, recurrent, moderate: Secondary | ICD-10-CM | POA: Diagnosis not present

## 2021-01-09 DIAGNOSIS — F909 Attention-deficit hyperactivity disorder, unspecified type: Secondary | ICD-10-CM | POA: Diagnosis not present

## 2021-01-09 DIAGNOSIS — F329 Major depressive disorder, single episode, unspecified: Secondary | ICD-10-CM | POA: Diagnosis not present

## 2021-01-09 DIAGNOSIS — Z046 Encounter for general psychiatric examination, requested by authority: Secondary | ICD-10-CM | POA: Insufficient documentation

## 2021-01-09 DIAGNOSIS — R4589 Other symptoms and signs involving emotional state: Secondary | ICD-10-CM

## 2021-01-09 DIAGNOSIS — Z20822 Contact with and (suspected) exposure to covid-19: Secondary | ICD-10-CM | POA: Insufficient documentation

## 2021-01-09 DIAGNOSIS — T1491XA Suicide attempt, initial encounter: Secondary | ICD-10-CM

## 2021-01-09 DIAGNOSIS — R9431 Abnormal electrocardiogram [ECG] [EKG]: Secondary | ICD-10-CM | POA: Diagnosis not present

## 2021-01-09 DIAGNOSIS — R4587 Impulsiveness: Secondary | ICD-10-CM | POA: Diagnosis not present

## 2021-01-09 DIAGNOSIS — R45851 Suicidal ideations: Secondary | ICD-10-CM | POA: Diagnosis not present

## 2021-01-09 LAB — CBC
HCT: 35.7 % — ABNORMAL LOW (ref 36.0–46.0)
Hemoglobin: 12 g/dL (ref 12.0–15.0)
MCH: 28.7 pg (ref 26.0–34.0)
MCHC: 33.6 g/dL (ref 30.0–36.0)
MCV: 85.4 fL (ref 80.0–100.0)
Platelets: 196 10*3/uL (ref 150–400)
RBC: 4.18 MIL/uL (ref 3.87–5.11)
RDW: 13.3 % (ref 11.5–15.5)
WBC: 8.9 10*3/uL (ref 4.0–10.5)
nRBC: 0 % (ref 0.0–0.2)

## 2021-01-09 LAB — COMPREHENSIVE METABOLIC PANEL
ALT: 11 U/L (ref 0–44)
AST: 15 U/L (ref 15–41)
Albumin: 3.2 g/dL — ABNORMAL LOW (ref 3.5–5.0)
Alkaline Phosphatase: 57 U/L (ref 38–126)
Anion gap: 8 (ref 5–15)
BUN: <5 mg/dL — ABNORMAL LOW (ref 6–20)
CO2: 23 mmol/L (ref 22–32)
Calcium: 8.8 mg/dL — ABNORMAL LOW (ref 8.9–10.3)
Chloride: 104 mmol/L (ref 98–111)
Creatinine, Ser: 0.51 mg/dL (ref 0.44–1.00)
GFR, Estimated: >60 mL/min (ref >60)
Glucose, Bld: 80 mg/dL (ref 70–99)
Potassium: 3.6 mmol/L (ref 3.5–5.1)
Sodium: 135 mmol/L (ref 135–145)
Total Bilirubin: 0.6 mg/dL (ref 0.3–1.2)
Total Protein: 6.5 g/dL (ref 6.5–8.1)

## 2021-01-09 LAB — RAPID URINE DRUG SCREEN, HOSP PERFORMED
Amphetamines: NOT DETECTED
Barbiturates: NOT DETECTED
Benzodiazepines: NOT DETECTED
Cocaine: NOT DETECTED
Opiates: NOT DETECTED
Tetrahydrocannabinol: NOT DETECTED

## 2021-01-09 LAB — ACETAMINOPHEN LEVEL: Acetaminophen (Tylenol), Serum: <10 ug/mL — ABNORMAL LOW (ref 10–30)

## 2021-01-09 LAB — I-STAT BETA HCG BLOOD, ED (MC, WL, AP ONLY): I-stat hCG, quantitative: >2000 m[IU]/mL — ABNORMAL HIGH (ref <5)

## 2021-01-09 LAB — SALICYLATE LEVEL: Salicylate Lvl: <7 mg/dL — ABNORMAL LOW (ref 7.0–30.0)

## 2021-01-09 LAB — ETHANOL: Alcohol, Ethyl (B): <10 mg/dL (ref <10)

## 2021-01-09 NOTE — ED Notes (Signed)
Report called to Asher Muir, Geophysicist/field seismologist Nurse [facility] ED.

## 2021-01-09 NOTE — ED Provider Notes (Signed)
Emergency Medicine Provider Triage Evaluation Note  Laurie Hansen , a 25 y.o. female  was evaluated in triage.  Pt brought in from [facility] under IVC.  Patient had argument with boyfriend and threatened suicide by putting a few nortriptyline's in her mouth and then spit them out.  Has no active suicidal ideation.  Behavioral health urgent care recommended overnight observation which she was voluntary for, however required monitoring in the ED due to pregnancy status at 4 months gestation.  IVC was placed for transport.  Review of Systems  Positive:  Negative: HI  Physical Exam  BP 117/64 (BP Location: Right Arm)   Pulse 80   Temp 98.5 F (36.9 C)   Resp 17   LMP 08/23/2020   SpO2 100%  Gen:   Awake, no distress   Resp:  Normal effort  MSK:   Moves extremities without difficulty  Psych: Calm and cooperative  Medical Decision Making  Medically screening exam initiated at 6:41 PM.  Appropriate orders placed.  Laurie Hansen was informed that the remainder of the evaluation will be completed by another provider, this initial triage assessment does not replace that evaluation, and the importance of remaining in the ED until their evaluation is complete.     Laurie Hansen, Laurie N, PA-C 01/09/21 1843    Arby Barrette, MD 01/14/21 [phone]

## 2021-01-09 NOTE — ED Provider Notes (Signed)
[facility] EMERGENCY DEPARTMENT Provider Note   CSN: [id number] Arrival date & time: 01/09/21  1729     History No chief complaint on file.   Laurie Hansen is a 25 y.o. female with PMHx ADHD, depression, presenting to the ED via [facility] under IVC from [facility]. She went to [facility] today for threatened SI attempt. She reports she was in an argument with her boyfriend. She grabbed a few of her prescribed nortriptylline and put them in her mouth. She says she spit them out, did not ingest any nor did she have the intent to ingest them. She was evaluated at [facility] and was recommended for overnight observation, however due to her current pregnancy of about 4 months, they could not accommodate her there and therefore sent her to the ED. She was recommended to be placed under IVC for safe transport because she was recommended for IVC if she was no longer voluntary. She denies active SI or HI. No pregnancy complications. No medical complaints. Denies drug and alcohol use.   The history is provided by the patient and medical records.      Past Medical History:  Diagnosis Date   ADHD (attention deficit hyperactivity disorder)    Attention deficit hyperactivity disorder (ADHD) 12/24/2019   Depression    Headache(784.0)     Patient Active Problem List   Diagnosis Date Noted   Supervision of low-risk pregnancy 11/23/2020   Attention deficit hyperactivity disorder (ADHD) 12/24/2019   Migraine with aura, without mention of intractable migraine without mention of status migrainosus 10/21/2013   Variants of migraine, not elsewhere classified, without mention of intractable migraine without mention of status migrainosus 10/21/2013    History reviewed. No pertinent surgical history.   OB History     Gravida  4   Para  1   Term  1   Preterm  0   AB  2   Living  1      SAB  0   IAB  2   Ectopic  0   Multiple  0   Live Births  1           Family History  Problem  Relation Age of Onset   Migraines Mother    Cancer Mother    Lupus Mother    Hypertension Maternal Grandmother    COPD Maternal Grandmother    Asthma Maternal Grandmother    Migraines Paternal Grandmother    Diabetes Paternal Grandmother    Migraines Brother    Migraines Other        Maternal great-grandmother   Migraines Other        Two maternal great aunts   Aneurysm Other        Maternal great uncle    Social History   Tobacco Use   Smoking status: Never   Smokeless tobacco: Never  Vaping Use   Vaping Use: Never used  Substance Use Topics   Alcohol use: No   Drug use: No    Home Medications Prior to Admission medications   Medication Sig Start Date End Date Taking? Authorizing Provider  Doxylamine-Pyridoxine (DICLEGIS) 10-10 MG TBEC Take 2 tablets at bedtime and one in the morning and one in the afternoon as needed for nausea. 11/05/20   Burleson, Brand Males, NP  metroNIDAZOLE (FLAGYL) 500 MG tablet Take 1 tablet (500 mg total) by mouth 2 (two) times daily. 11/26/20   Gita Kudo, MD  Prenatal MV & Min w/FA-DHA (PRENATAL GUMMIES  PO) Take 1 tablet by mouth daily.    [provider]  tinidazole (TINDAMAX) 500 MG tablet Take 4 tablets (2,000 mg total) by mouth daily with breakfast. 11/30/20   Hermina Staggers, MD    Allergies    Patient has no known allergies.  Review of Systems   Review of Systems  Psychiatric/Behavioral:  Positive for dysphoric mood.   All other systems reviewed and are negative.  Physical Exam Updated Vital Signs BP 117/64 (BP Location: Right Arm)   Pulse 80   Temp 98.5 F (36.9 C)   Resp 17   LMP 08/23/2020   SpO2 100%   Physical Exam Vitals and nursing note reviewed.  Constitutional:      Appearance: She is well-developed.  HENT:     Head: Normocephalic and atraumatic.  Eyes:     Conjunctiva/sclera: Conjunctivae normal.  Cardiovascular:     Rate and Rhythm: Normal rate and regular rhythm.  Pulmonary:     Effort:  Pulmonary effort is normal.     Breath sounds: Normal breath sounds.  Abdominal:     Palpations: Abdomen is soft.     Comments: Gravid abdomen  Skin:    General: Skin is warm.  Neurological:     Mental Status: She is alert.  Psychiatric:        Attention and Perception: Attention normal.        Mood and Affect: Mood normal.        Speech: Speech normal.        Behavior: Behavior normal. Behavior is cooperative.        Thought Content: Thought content does not include homicidal ideation.        Judgment: Judgment is impulsive.    ED Results / Procedures / Treatments   Labs (all labs ordered are listed, but only abnormal results are displayed) Labs Reviewed  COMPREHENSIVE METABOLIC PANEL - Abnormal; Notable for the following components:      Result Value   BUN <5 (*)    Calcium 8.8 (*)    Albumin 3.2 (*)    All other components within normal limits  SALICYLATE LEVEL - Abnormal; Notable for the following components:   Salicylate Lvl <7.0 (*)    All other components within normal limits  ACETAMINOPHEN LEVEL - Abnormal; Notable for the following components:   Acetaminophen (Tylenol), Serum <10 (*)    All other components within normal limits  CBC - Abnormal; Notable for the following components:   HCT 35.7 (*)    All other components within normal limits  I-STAT BETA HCG BLOOD, ED ([facility], [facility], [facility] ONLY) - Abnormal; Notable for the following components:   I-stat hCG, quantitative >2,000.0 (*)    All other components within normal limits  RESP PANEL BY RT-PCR (FLU A&B, COVID) ARPGX2  ETHANOL  RAPID URINE DRUG SCREEN, HOSP PERFORMED    EKG None  Radiology No results found.  Procedures Procedures   Medications Ordered in ED Medications - No data to display  ED Course  I have reviewed the triage vital signs and the nursing notes.  Pertinent labs & imaging results that were available during my care of the patient were reviewed by me and considered in my medical decision  making (see chart for details).    MDM Rules/Calculators/A&P                          Patient here under IVC from [facility] for threatened suicide  as described above. She denies active SI, states it was impulsive and had no intent of swallowing the pills or harming herself. She did not swallow any of the tablets. She is calm and cooperative. No pregnancy complaints. Pt is medically cleared.  Final Clinical Impression(s) / ED Diagnoses Final diagnoses:  None    Rx / DC Orders ED Discharge Orders     None        Yvanna Vidas, [place] N, PA-C 01/09/21 2255    Milagros Loll, MD 01/11/21 1046

## 2021-01-09 NOTE — ED Notes (Signed)
Pt wanded by security. 

## 2021-01-09 NOTE — ED Provider Notes (Signed)
IVC paperwork filed with the magistrate office.

## 2021-01-09 NOTE — BH Assessment (Signed)
Patient presents to [facility] after Patient and Boyfriend were arguing , she wanted him to leave her alone, he would not leave her alone so she grabbed 6 sleeping pills put them in her mouth while he was calling police she spit them out and then police brought her to [facility]. Patient denies SI/ HI / AVH or any substance use . Patient denies wanting to kill herself and can contract safety. Patient is routine

## 2021-01-09 NOTE — ED Notes (Signed)
Pt changed into scrubs and security called to wand pt.  All belongings bagged, labeled, and placed in mini locker 4.

## 2021-01-09 NOTE — ED Triage Notes (Signed)
Pt here with [employer] from [facility] with IVC papers.  Reports being 4 months pregnant and states that she was in an argument and put a handful of Nortriptyline in her mouth and then spit them out.  Denies suicidal ideation.

## 2021-01-09 NOTE — ED Notes (Signed)
Pt discharged from facility with [employer]. IVC paperwork given to law enforcement.Report previously called to Advertising account planner at [facility]. Pt alert, orient and ambulatory. Safety maintained.

## 2021-01-09 NOTE — BH Assessment (Signed)
Comprehensive Clinical Assessment (CCA) Note  01/09/2021 Laurie Hansen [id number]  DISPOSITION: Gave clinical report to Laurie NixonPatrice White, NP  who determined Pt meets criteria for overnight observation at ED. Notified Dr. Nelly RoutArchana Kumar, MD  and Marland McalpineMatthew Loflin ,LPN of disposition recommendation and the sitter utilization recommendation.   [facility]  C-SSRS RISK CATEGORY Low Risk       The patient demonstrates the following risk factors for suicide: Chronic risk factors for suicide include: N/A. Acute risk factors for suicide include: N/A. Protective factors for this patient include: positive social support. Considering these factors, the overall suicide risk at this point appears to be no. Patient is not appropriate for outpatient follow up.   Pt is a 25 yo female who [facility]?. Pt was accompanied by [employer] reporting argument with boyfriend . Pt has no history of mental health and says she was referred for assessment by [employer]. Pt denies medication. Pt denies current suicidal ideation with no plans of self harm or past attempts. Patient took 6 sleeping pills but reports spitting them out after argument with boyfriend   Pt denies homicidal ideation/ history of violence. Pt denies auditory & visual hallucinations or other symptoms of psychosis. Pt states current stressors include her boyfriend and his foolishness . "This shit happened because he would not leave me the alone ". "I want him out of my home now ". I was just trying to enjoy my weekend off and now I have to stay overnight in a hospital."  Pt lives alone and supports include family ?. Pt denies a hx of abuse and trauma. 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 Pt's memory is intact and denies any legal history.   Protective factors against suicide  include good family support, no current suicidal ideation, future orientation, therapeutic relationship, no access to firearms, no current psychotic symptoms and no prior attempts.  Pt' denies  OP/ IP  history includes . Pt denies alcohol/ substance abuse.    MSE: Pt is casually dressed, alert, oriented x5 with normal speech and normal motor behavior. Eye contact is good. Pt's mood is angry and affect is irritable and angry.  Affect is congruent with mood. Thought process is coherent and relevant. There is no indication Pt is currently responding to internal stimuli or experiencing delusional thought content. Pt was cooperative throughout assessment.   DISPOSITION: Gave clinical report to Laurie NixonPatrice White, NP  who determined Pt meets criteria for overnight observation at ED. Notified Dr. Nelly RoutArchana Kumar, MD  and Marland McalpineMatthew Loflin ,LPN of disposition recommendation and the sitter utilization recommendation.  Chief Complaint: No chief complaint on file.  Visit Diagnosis: None     CCA Screening, Triage and Referral (STR)  Patient Reported Information How did you hear about us? Family/Friend  What Is the Reason for Your Visit/Call Today? Patient and Boyfriend were arguing , she wanted him to leave her alone, he would not leave her alone so she grabbed 6 sleeping pills put them in her mouth while he was calling police she spit them out and then [facility]  How Long Has This Been Causing You Problems? <Week  What Do You Feel Would Help You the Most Today? No data recorded  Have You Recently Had Any Thoughts About Hurting Yourself? No  Are You  Planning to Commit Suicide/Harm Yourself At This time? No   Have you Recently Had Thoughts About Hurting Someone Laurie Hansen? No  Are You Planning to Harm Someone at This Time? No  Explanation: No data recorded  Have You Used Any Alcohol or Drugs in the Past 24 Hours? No  How Long Ago Did You Use Drugs or Alcohol? No data recorded What Did You  Use and How Much? No data recorded  Do You Currently Have a Therapist/Psychiatrist? No data recorded Name of Therapist/Psychiatrist: No data recorded  Have You Been Recently Discharged From Any Office Practice or Programs? No data recorded Explanation of Discharge From Practice/Program: No data recorded    CCA Screening Triage Referral Assessment Type of Contact: No data recorded Telemedicine Service Delivery:   Is this Initial or Reassessment? No data recorded Date Telepsych consult ordered in [facility]:  No data recorded Time Telepsych consult ordered in [facility]:  No data recorded Location of Assessment: No data recorded Provider Location: No data recorded  Collateral Involvement: No data recorded  Does Patient Have a Court Appointed Legal Guardian? No data recorded Name and Contact of Legal Guardian: No data recorded If Minor and Not Living with Parent(s), Who has Custody? No data recorded Is CPS involved or ever been involved? No data recorded Is APS involved or ever been involved? No data recorded  Patient Determined To Be At Risk for Harm To Self or Others Based on Review of Patient Reported Information or Presenting Complaint? No data recorded Method: No data recorded Availability of Means: No data recorded Intent: No data recorded Notification Required: No data recorded Additional Information for Danger to Others Potential: No data recorded Additional Comments for Danger to Others Potential: No data recorded Are There Guns or Other Weapons in Your Home? No data recorded Types of Guns/Weapons: No data recorded Are These Weapons Safely Secured?                            No data recorded Who Could Verify You Are Able To Have These Secured: No data recorded Do You Have any Outstanding Charges, Pending Court Dates, Parole/Probation? No data recorded Contacted To Inform of Risk of Harm To Self or Others: No data recorded   Does Patient Present under Involuntary Commitment? No data  recorded IVC Papers Initial File Date: No data recorded  [place] of Residence: No data recorded  Patient Currently Receiving the Following Services: No data recorded  Determination of Need: Routine (7 days)   Options For Referral: ED Visit     CCA Biopsychosocial Patient Reported Schizophrenia/Schizoaffective Diagnosis in Past: No   Strengths: No data recorded  Mental Health Symptoms Depression:   None   Duration of Depressive symptoms:    Mania:   N/A   Anxiety:    Irritability   Psychosis:   None   Duration of Psychotic symptoms:    Trauma:   None; N/A   Obsessions:   N/A   Compulsions:   N/A   Inattention:   N/A   Hyperactivity/Impulsivity:   N/A   Oppositional/Defiant Behaviors:   N/A   Emotional Irregularity:   Intense/unstable relationships   Other Mood/Personality Symptoms:  No data recorded   Mental Status Exam Appearance and self-care  Stature:   Average   Weight:   Average weight   Clothing:   Casual   Grooming:   Normal   Cosmetic use:   Age appropriate   Posture/gait:  Tense   Motor activity:   Agitated   Sensorium  Attention:   Normal   Concentration:   Normal   Orientation:   X5   Recall/memory:   Normal   Affect and Mood  Affect:   Appropriate   Mood:   Angry   Relating  Eye contact:   Normal   Facial expression:   Angry   Attitude toward examiner:   Irritable; Cooperative   Thought and Language  Speech flow:  Clear and Coherent   Thought content:   Appropriate to Mood and Circumstances   Preoccupation:   None   Hallucinations:   None   Organization:  No data recorded  [employer] of Knowledge:   Good   Intelligence:   Average   Abstraction:   Functional   Judgement:   Fair   Reality Testing:   Adequate   Insight:   Fair   Decision Making:   Impulsive   Social Functioning  Social Maturity:   Responsible   Social Judgement:   Normal    Stress  Stressors:   Family conflict   Coping Ability:   Normal   Skill Deficits:   None   Supports:   Family     Religion:    Leisure/Recreation: Leisure / Recreation Do You Have Hobbies?: No  Exercise/Diet: Exercise/Diet Do You Exercise?: No Have You Gained or Lost A Significant Amount of Weight in the Past Six Months?: No Do You Follow a Special Diet?: No Do You Have Any Trouble Sleeping?: No   CCA Employment/Education Employment/Work Situation: Employment / Work Situation Employment Situation: Employed Patient's Job has Been Impacted by Current Illness: No Has Patient ever Been in Equities trader?: No  Education: Education Is Patient Currently Attending School?: No   CCA Family/Childhood History Family and Relationship History: Family history Marital status: Single Does patient have children?: No  Childhood History:  Childhood History By whom was/is the patient raised?: Both parents Did patient suffer any verbal/emotional/physical/sexual abuse as a child?: No Did patient suffer from severe childhood neglect?: No Has patient ever been sexually abused/assaulted/raped as an adolescent or adult?: No Was the patient ever a victim of a crime or a disaster?: No Witnessed domestic violence?: No Has patient been affected by domestic violence as an adult?: No  Child/Adolescent Assessment:     CCA Substance Use Alcohol/Drug Use: Alcohol / Drug Use Pain Medications: SEE MAR Prescriptions: SEE MAR Over the Counter: SEE MAR History of alcohol / drug use?: No history of alcohol / drug abuse                         ASAM's:  Six Dimensions of Multidimensional Assessment  Dimension 1:  Acute Intoxication and/or Withdrawal Potential:      Dimension 2:  Biomedical Conditions and Complications:      Dimension 3:  Emotional, Behavioral, or Cognitive Conditions and Complications:     Dimension 4:  Readiness to Change:     Dimension 5:  Relapse,  Continued use, or Continued Problem Potential:     Dimension 6:  Recovery/Living Environment:     ASAM Severity Score:    ASAM Recommended Level of Treatment:     Substance use Disorder (SUD)    Recommendations for Services/Supports/Treatments:    Discharge Disposition:    DSM5 Diagnoses: Patient Active Problem List   Diagnosis Date Noted   Supervision of low-risk pregnancy 11/23/2020   Attention deficit hyperactivity disorder (ADHD)  12/24/2019   Migraine with aura, without mention of intractable migraine without mention of status migrainosus 10/21/2013   Variants of migraine, not elsewhere classified, without mention of intractable migraine without mention of status migrainosus 10/21/2013     Referrals to Alternative Service(s): Referred to Alternative Service(s):   Place:   Date:   Time:    Referred to Alternative Service(s):   Place:   Date:   Time:    Referred to Alternative Service(s):   Place:   Date:   Time:    Referred to Alternative Service(s):   Place:   Date:   Time:     Rachel Moulds, [place]

## 2021-01-09 NOTE — ED Provider Notes (Addendum)
Behavioral Health Urgent Care Medical Screening Exam  Patient Name: Laurie Hansen MRN: [id number] Date of Evaluation: 01/09/21 Chief Complaint:   Diagnosis:  Final diagnoses:  Suicide attempt [facility])  MDD (major depressive disorder), recurrent episode, moderate (HCC)    History of Present illness: Laurie Hansen is a 25 y.o. female patient who presents to the [facility] voluntarily as a walk-in accompanied by law enforcement with a chief complaint of "taking a brunch of pills." Hx includes: MDD and patient is "4 months pregnant."   Patient reports that she got into an argument with her boyfriend and was going to take some pills because she had a headache but acted like she was going to take a bunch of pills so that he could leave her alone. She states that she put a brunch of pills in her mouth but then she spit them out. She states that the police showed up and recommended that she go be evaluated. She states that it was not that serious and is requesting to leave because she is off work this weekend.  Patient was recommended for overnight observation and this provider explained the process to the patient. She initially stated that she wanted to leave after it was explained to her that if she doesn't agree to voluntary treatment she would be involuntarily committed. She later agreed to voluntary treatment with the TTS counselor.  Patient gave verbal consent for this provider to get collateral from her boyfriend Rich Fuchs [phone]: Mr. Lyn Hollingshead states that the two had planned to go out of town this morning. He states that he told the patient to get ready and that he had to leave out and would be right back. He states that when he go back she still was not ready. He states that the two started arguing over nothing that was important because she was not ready to go. He states that he was getting ready to leave the house and she stated that she felt  like she was going to die. He states that she already had pills on the bed and as he was walking out she put a bunch of pills in her mouth. He states that he ran out the room to get his cell phone and called the police. He states that he does not know if she spit the pills out. He states that he is concerned because she's never done anything like this before and she's pregnant.   On approach, the patient is alert and oriented x 4. She vaguely answers questions. Her mood is depressed and irritable and affect is congruent. Her thought process is logical, speech coherent and tone is decreased. She is causally dressed. She denies suicidal thoughts. She denies self harm behaviors. She denies a prior hx of suicide attempts. She  denies homicidal ideations. She denies AVH. She does not appear to be responding to internal, or external stimuli. She denies drinking alcohol or using illicit drugs. She denies prior psychiatric hospitalizations. She states that she was dx with depression when she was younger and was on medications but couldn't recall the name of the medication. She denies current outpatient treatment for psychiatry or therapy.    Psychiatric Specialty Exam  Presentation  General Appearance:Appropriate for Environment  Eye Contact:Fair  Speech:Clear and Coherent  Speech Volume:Decreased  Handedness:No data recorded  Mood and Affect  Mood:Depressed; Irritable  Affect:Congruent   Thought Process  Thought Processes:Coherent  Descriptions of Associations:Intact  Orientation:No data recorded Thought  Content:WDL    Hallucinations:None  Ideas of Reference:No data recorded Suicidal Thoughts:No  Homicidal Thoughts:No   Sensorium  Memory:Immediate Fair; Remote Fair; Recent Fair  Judgment:Poor  Insight:Poor   Executive Functions  Concentration:Fair  Attention Span:Fair  Recall:Fair  Fund of Knowledge:Fair  Language:Fair   Psychomotor Activity  Psychomotor  Activity:Normal   Assets  Assets:Communication Skills; Housing; Intimacy; Leisure Time; Physical Health; Social Support   Sleep  Sleep:Fair  Number of hours:  No data recorded  No data recorded  Physical Exam: Physical Exam HENT:     Head: Normocephalic.  Eyes:     Conjunctiva/sclera: Conjunctivae normal.  Cardiovascular:     Rate and Rhythm: Normal rate.  Pulmonary:     Effort: Pulmonary effort is normal.  Musculoskeletal:        General: Normal range of motion.     Cervical back: Normal range of motion.  Neurological:     Mental Status: She is alert.  Psychiatric:        Attention and Perception: Attention normal.        Mood and Affect: Affect is labile.        Speech: Speech normal.   Review of Systems  Constitutional: Negative.   HENT: Negative.    Eyes: Negative.   Respiratory: Negative.    Cardiovascular: Negative.   Gastrointestinal: Negative.   Genitourinary: Negative.   Musculoskeletal: Negative.   Skin: Negative.   Neurological: Negative.   Endo/Heme/Allergies: Negative.   Psychiatric/Behavioral:  Positive for suicidal ideas.   Blood pressure 108/68, pulse 72, temperature 98 F (36.7 C), temperature source Tympanic, resp. rate 16, last menstrual period 08/23/2020, SpO2 100 %, unknown if currently breastfeeding. There is no height or weight on file to calculate BMI.  Musculoskeletal: Strength & Muscle Tone: within normal limits Gait & Station: normal Patient leans: N/A   [facility] MSE Discharge Disposition for Follow up and Recommendations: Based on my evaluation I certify that psychiatric inpatient services furnished can reasonably be expected to improve the patient's condition which I recommend transfer to an appropriate accepting facility.  Patient will be transferred to [facility] for overnight observation and reassessment by psychiatry in the morning. Patient does not meet criteria for [facility] due to pregnancy. Report called to Dr. Donnald Garre, who recommends  the patient be IVC'd before transporting to the [facility]. IVC paperwork filed with the magistrate office.    Layla Barter, NP 01/09/2021, 3:18 PM

## 2021-01-10 DIAGNOSIS — R4589 Other symptoms and signs involving emotional state: Secondary | ICD-10-CM

## 2021-01-10 HISTORY — DX: Other symptoms and signs involving emotional state: R45.89

## 2021-01-10 LAB — RESP PANEL BY RT-PCR (FLU A&B, COVID) ARPGX2
Influenza A by PCR: NEGATIVE
Influenza B by PCR: NEGATIVE
SARS Coronavirus 2 by RT PCR: NEGATIVE

## 2021-01-10 NOTE — ED Notes (Signed)
Resting with eyes closed.

## 2021-01-10 NOTE — Discharge Instructions (Addendum)
Please see the attached list for outpatient therapy services.  Return to the ER for any new or worsening symptoms.

## 2021-01-10 NOTE — ED Notes (Signed)
Patient received personal belongings from mini locker #4. Patient left ED ambulatory with steady independent gait and is A/O. Patient was on her cell phone at time of discharge and the conversation sounded like an argument. Patient verbalized understanding of written DC instructions - whic included a community outpatient resource lst. Patient was informed of upcoming scheduled appoints and was encouraged to return to ED if she feels like she may harm herself.

## 2021-01-10 NOTE — ED Provider Notes (Signed)
Approached by NP Hillery Jacks, patient is psych cleared.  Plan for discharge with outpatient follow-up.  Patient has no pregnancy related complaints.  Stable for discharge.   Mare Ferrari, PA-C 01/10/21 1249    Arby Barrette, MD 01/14/21 [phone]

## 2021-01-10 NOTE — ED Notes (Addendum)
Assumed care of pt.  She denies SI/HI.  States she feels fine and is ready to go home.  Fetal HR assessed and COVID swab collected.  Ate breakfast.  Denies any needs at this time.
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
## 2021-01-19 ENCOUNTER — Other Ambulatory Visit: Payer: Self-pay | Admitting: Lactation Services

## 2021-01-19 MED ORDER — FLUTICASONE PROPIONATE 50 MCG/ACT NA SUSP: 2.0000 | Freq: Every day | NASAL | 2 refills | Status: DC

## 2021-02-02 ENCOUNTER — Other Ambulatory Visit: Payer: Self-pay

## 2021-02-02 ENCOUNTER — Ambulatory Visit: Payer: Medicaid Other | Attending: Obstetrics

## 2021-02-02 ENCOUNTER — Encounter: Payer: Self-pay | Admitting: *Deleted

## 2021-02-02 ENCOUNTER — Ambulatory Visit: Payer: Medicaid Other | Admitting: *Deleted

## 2021-02-02 ENCOUNTER — Other Ambulatory Visit: Payer: Self-pay | Admitting: *Deleted

## 2021-02-02 VITALS — BP 117/57 | HR 76

## 2021-02-02 DIAGNOSIS — Z3492 Encounter for supervision of normal pregnancy, unspecified, second trimester: Secondary | ICD-10-CM | POA: Insufficient documentation

## 2021-02-02 DIAGNOSIS — O358XX0 Maternal care for other (suspected) fetal abnormality and damage, not applicable or unspecified: Secondary | ICD-10-CM

## 2021-02-02 DIAGNOSIS — Z362 Encounter for other antenatal screening follow-up: Secondary | ICD-10-CM

## 2021-02-02 DIAGNOSIS — Z3A22 22 weeks gestation of pregnancy: Secondary | ICD-10-CM

## 2021-02-02 DIAGNOSIS — Z3687 Encounter for antenatal screening for uncertain dates: Secondary | ICD-10-CM

## 2021-02-12 ENCOUNTER — Other Ambulatory Visit: Payer: Self-pay

## 2021-02-12 ENCOUNTER — Ambulatory Visit (INDEPENDENT_AMBULATORY_CARE_PROVIDER_SITE_OTHER): Payer: Medicaid Other | Admitting: Family Medicine

## 2021-02-12 ENCOUNTER — Encounter: Payer: Self-pay | Admitting: Family Medicine

## 2021-02-12 VITALS — BP 105/68 | HR 95 | Wt 166.7 lb

## 2021-02-12 DIAGNOSIS — Z6791 Unspecified blood type, Rh negative: Secondary | ICD-10-CM

## 2021-02-12 DIAGNOSIS — O283 Abnormal ultrasonic finding on antenatal screening of mother: Secondary | ICD-10-CM

## 2021-02-12 DIAGNOSIS — Z3492 Encounter for supervision of normal pregnancy, unspecified, second trimester: Secondary | ICD-10-CM

## 2021-02-12 DIAGNOSIS — O26899 Other specified pregnancy related conditions, unspecified trimester: Secondary | ICD-10-CM | POA: Diagnosis not present

## 2021-02-12 DIAGNOSIS — Z3A24 24 weeks gestation of pregnancy: Secondary | ICD-10-CM

## 2021-02-12 NOTE — Progress Notes (Signed)
    Subjective:  Laurie Hansen is a 25 y.o. [phone] at [redacted]w[redacted]d being seen today for ongoing prenatal care.  She is currently monitored for the following issues for this low-risk pregnancy and has Migraine with aura, without mention of intractable migraine without mention of status migrainosus; Attention deficit hyperactivity disorder (ADHD); Supervision of low-risk pregnancy; and Suicidal behavior on their problem list.  Patient reports no complaints. Doing well, mood is good (possible attempt of ingesting excess pills on 6/25 ED visit). Denies any depressed/anxious mood. No SI/HI. Not interested in meeting with behavioral specialist.   Contractions: Not present.  .  Movement: Present. Denies leaking of fluid.   The following portions of the patient's history were reviewed and updated as appropriate: allergies, current medications, past family history, past medical history, past social history, past surgical history and problem list.   Objective:   Vitals:   02/12/21 0826  BP: 105/68  Pulse: 95  Weight: 166 lb 11.2 oz (75.6 kg)    Fetal Status: Fetal Heart Rate (bpm): 152   Movement: Present     General:  Alert, oriented and cooperative. Patient is in no acute distress.  Skin: Skin is warm and dry. No rash noted.   Cardiovascular: Normal heart rate noted  Respiratory: Normal respiratory effort, no problems with respiration noted  Abdomen: Soft, gravid, appropriate for gestational age. Pain/Pressure: Absent     Pelvic:  Cervical exam deferred        Extremities: Normal range of motion.  Edema: None  Mental Status: Normal mood and affect. Normal behavior. Normal judgment and thought content.    Assessment and Plan:  Pregnancy: G4W1027 at [redacted]w[redacted]d, doing well.   1. Encounter for supervision of low-risk pregnancy in second trimester No concerns. Will need to discuss contraception on next visit.   2. Rh negative state in antepartum period A negative blood type, recheck today (not  collected with initial labs). Will need RhoGAM at 28 weeks.  - ABO/Rh - Antibody screen  3. Abnormal antenatal ultrasound Possible muscular VSD on MFM U/S 7/19, scheduled for fetal echo on 8/10. F/u MFM U/S 8/30.   Preterm labor symptoms and general obstetric precautions including but not limited to vaginal bleeding, contractions, leaking of fluid and fetal movement were reviewed in detail with the patient.  Please refer to After Visit Summary for other counseling recommendations.   Return in about 4 weeks (around 03/12/2021) for LROB with GTT/28 week labs + RhoGam.   Allayne Stack, DO

## 2021-02-12 NOTE — Patient Instructions (Signed)
Remember the next visit will include the glucose test--plan to be fasting and here for 2 hours.   If you have any leakage of fluid, vaginal bleeding, or decreased movement of baby--[facility].

## 2021-02-12 NOTE — Progress Notes (Signed)
Patient stated she has no questions or concerns

## 2021-02-13 LAB — ABO/RH: Rh Factor: NEGATIVE

## 2021-02-13 LAB — ANTIBODY SCREEN: Antibody Screen: NEGATIVE

## 2021-02-18 DIAGNOSIS — Z20822 Contact with and (suspected) exposure to covid-19: Secondary | ICD-10-CM | POA: Diagnosis not present

## 2021-02-24 DIAGNOSIS — Q211 Atrial septal defect: Secondary | ICD-10-CM | POA: Diagnosis not present

## 2021-02-24 DIAGNOSIS — O358XX0 Maternal care for other (suspected) fetal abnormality and damage, not applicable or unspecified: Secondary | ICD-10-CM | POA: Diagnosis not present

## 2021-03-16 ENCOUNTER — Other Ambulatory Visit: Payer: Self-pay | Admitting: *Deleted

## 2021-03-16 ENCOUNTER — Other Ambulatory Visit: Payer: Self-pay

## 2021-03-16 ENCOUNTER — Encounter: Payer: Self-pay | Admitting: *Deleted

## 2021-03-16 ENCOUNTER — Ambulatory Visit: Payer: Medicaid Other | Attending: Obstetrics and Gynecology

## 2021-03-16 ENCOUNTER — Ambulatory Visit: Payer: Medicaid Other | Admitting: *Deleted

## 2021-03-16 VITALS — BP 115/59 | HR 74

## 2021-03-16 DIAGNOSIS — Z362 Encounter for other antenatal screening follow-up: Secondary | ICD-10-CM

## 2021-03-16 DIAGNOSIS — Z3493 Encounter for supervision of normal pregnancy, unspecified, third trimester: Secondary | ICD-10-CM

## 2021-03-16 DIAGNOSIS — Z3A28 28 weeks gestation of pregnancy: Secondary | ICD-10-CM

## 2021-03-16 DIAGNOSIS — O358XX0 Maternal care for other (suspected) fetal abnormality and damage, not applicable or unspecified: Secondary | ICD-10-CM

## 2021-03-18 ENCOUNTER — Other Ambulatory Visit: Payer: Self-pay

## 2021-03-18 ENCOUNTER — Ambulatory Visit (INDEPENDENT_AMBULATORY_CARE_PROVIDER_SITE_OTHER): Payer: Medicaid Other | Admitting: Family Medicine

## 2021-03-18 ENCOUNTER — Encounter: Payer: Self-pay | Admitting: Family Medicine

## 2021-03-18 ENCOUNTER — Other Ambulatory Visit: Payer: Medicaid Other

## 2021-03-18 ENCOUNTER — Other Ambulatory Visit: Payer: Self-pay | Admitting: General Practice

## 2021-03-18 VITALS — BP 137/72 | HR 82 | Wt 176.4 lb

## 2021-03-18 DIAGNOSIS — Z6791 Unspecified blood type, Rh negative: Secondary | ICD-10-CM | POA: Diagnosis not present

## 2021-03-18 DIAGNOSIS — Z3493 Encounter for supervision of normal pregnancy, unspecified, third trimester: Secondary | ICD-10-CM

## 2021-03-18 DIAGNOSIS — O26899 Other specified pregnancy related conditions, unspecified trimester: Secondary | ICD-10-CM

## 2021-03-18 DIAGNOSIS — Z23 Encounter for immunization: Secondary | ICD-10-CM

## 2021-03-18 DIAGNOSIS — O35BXX0 Maternal care for other (suspected) fetal abnormality and damage, fetal cardiac anomalies, not applicable or unspecified: Secondary | ICD-10-CM

## 2021-03-18 DIAGNOSIS — Z362 Encounter for other antenatal screening follow-up: Secondary | ICD-10-CM

## 2021-03-18 DIAGNOSIS — O358XX0 Maternal care for other (suspected) fetal abnormality and damage, not applicable or unspecified: Secondary | ICD-10-CM

## 2021-03-18 DIAGNOSIS — O26893 Other specified pregnancy related conditions, third trimester: Secondary | ICD-10-CM

## 2021-03-18 DIAGNOSIS — Z3A28 28 weeks gestation of pregnancy: Secondary | ICD-10-CM

## 2021-03-18 DIAGNOSIS — M79606 Pain in leg, unspecified: Secondary | ICD-10-CM

## 2021-03-18 MED ORDER — RHO D IMMUNE GLOBULIN 1500 UNIT/2ML IJ SOSY
300.0000 ug | PREFILLED_SYRINGE | Freq: Once | INTRAMUSCULAR | Status: AC
  Administered 2021-03-18: 300 ug via INTRAMUSCULAR

## 2021-03-18 NOTE — Patient Instructions (Signed)
It was wonderful to see you!   Drink plenty of fluids. You can use tylenol 1000mg  up to 4 times daily. Try stretches daily, not to point of pain. Massage can be helpful as well. Try to offload pressure with belly band or pregnancy tape.   If getting worse, or persistent weakness, numbness-- be seen and discuss PT.

## 2021-03-18 NOTE — Progress Notes (Signed)
    Subjective:  Laurie Hansen is a 25 y.o. [phone] at [redacted]w[redacted]d being seen today for ongoing prenatal care.  She is currently monitored for the following issues for this low-risk pregnancy and has Migraine with aura, without mention of intractable migraine without mention of status migrainosus; Attention deficit hyperactivity disorder (ADHD); Supervision of low-risk pregnancy; Suicidal behavior; and Rh negative state in antepartum period on their problem list.  Patient reports  right back/leg ache without any persistent weakness, numbness or tingling. Worse if on that side for a long period of time .  Contractions: Not present. Vag. Bleeding: None.  Movement: Present. Denies leaking of fluid.   The following portions of the patient's history were reviewed and updated as appropriate: allergies, current medications, past family history, past medical history, past social history, past surgical history and problem list.   Objective:   Vitals:   03/18/21 0927  BP: 137/72  Pulse: 82  Weight: 176 lb 6.4 oz (80 kg)    Fetal Status: Fetal Heart Rate (bpm): 144 Fundal Height: 29 cm Movement: Present     General:  Alert, oriented and cooperative. Patient is in no acute distress.  Skin: Skin is warm and dry. No rash noted.   Cardiovascular: Normal heart rate noted  Respiratory: Normal respiratory effort, no problems with respiration noted  Abdomen: Soft, gravid, appropriate for gestational age. Pain/Pressure: Absent     Pelvic:  Cervical exam deferred        Extremities: Non-tender to palpation of lumbar spine and right LE. Exaggerated Lumbar lordosis noted. 5/5 muscle strength BLE. Normal gait. Normal range of motion.  Edema: Trace  Mental Status: Normal mood and affect. Normal behavior. Normal judgment and thought content.    Assessment and Plan:  Pregnancy: G4P1021 at [redacted]w[redacted]d  1. Encounter for supervision of low-risk pregnancy in third trimester Doing well.  - Tdap vaccine greater than or  equal to 7yo IM  2. Rh negative state in antepartum period - rho (d) immune globulin (RHIG/RHOPHYLAC) injection 300 mcg  3. Abnormal fetal echocardiography affecting antepartum care of mother, single or unspecified fetus Small to moderate VSD confirmed on fetal echo. She has f/u [facility] pediatric cardiology in 04/2021.   4. Pregnancy related leg pain in third trimester, antepartum Provided with daily stretches. May try massage, tylenol, ice PRN. Alarm s/sx discussed to RTC/MAU, may consider PT in the future if worsening.   5. [redacted] weeks gestation of pregnancy Labs and GTT collected today.   Preterm labor symptoms and general obstetric precautions including but not limited to vaginal bleeding, contractions, leaking of fluid and fetal movement were reviewed in detail with the patient. Please refer to After Visit Summary for other counseling recommendations.   Return in about 2 weeks (around 04/01/2021) for LROB.   Allayne Stack, DO

## 2021-03-19 ENCOUNTER — Other Ambulatory Visit: Payer: Self-pay | Admitting: Family Medicine

## 2021-03-19 DIAGNOSIS — O99013 Anemia complicating pregnancy, third trimester: Secondary | ICD-10-CM

## 2021-03-19 LAB — CBC
Hematocrit: 31.8 % — ABNORMAL LOW (ref 34.0–46.6)
Hemoglobin: 10 g/dL — ABNORMAL LOW (ref 11.1–15.9)
MCH: 26.2 pg — ABNORMAL LOW (ref 26.6–33.0)
MCHC: 31.4 g/dL — ABNORMAL LOW (ref 31.5–35.7)
MCV: 84 fL (ref 79–97)
Platelets: 204 10*3/uL (ref 150–450)
RBC: 3.81 x10E6/uL (ref 3.77–5.28)
RDW: 12.7 % (ref 11.7–15.4)
WBC: 5.5 10*3/uL (ref 3.4–10.8)

## 2021-03-19 LAB — RPR: RPR Ser Ql: NONREACTIVE

## 2021-03-19 LAB — GLUCOSE TOLERANCE, 2 HOURS W/ 1HR
Glucose, 1 hour: 66 mg/dL (ref 65–179)
Glucose, 2 hour: 101 mg/dL (ref 65–152)
Glucose, Fasting: 84 mg/dL (ref 65–91)

## 2021-03-19 LAB — HIV ANTIBODY (ROUTINE TESTING W REFLEX): HIV Screen 4th Generation wRfx: NONREACTIVE

## 2021-03-19 MED ORDER — FERROUS SULFATE 325 (65 FE) MG PO TABS: 325.0000 mg | ORAL_TABLET | ORAL | 3 refills | Status: DC

## 2021-03-23 ENCOUNTER — Other Ambulatory Visit: Payer: Self-pay | Admitting: *Deleted

## 2021-03-23 ENCOUNTER — Telehealth: Payer: Self-pay

## 2021-03-23 DIAGNOSIS — Q210 Ventricular septal defect: Secondary | ICD-10-CM

## 2021-03-23 NOTE — Telephone Encounter (Signed)
FETAL ECHO SCHEDULED FOR 04/22/21@930A .

## 2021-03-26 ENCOUNTER — Ambulatory Visit: Payer: Medicaid Other | Admitting: Family Medicine

## 2021-04-05 ENCOUNTER — Other Ambulatory Visit: Payer: Self-pay

## 2021-04-05 ENCOUNTER — Encounter: Payer: Medicaid Other | Admitting: Student

## 2021-04-06 NOTE — Progress Notes (Deleted)
   Subjective:    Patient ID: Laurie Hansen, female    DOB: [birth year], 25 y.o.   MRN: [id number]   CC: Establish care  HPI:  Laurie Hansen is a very pleasant 25 y.o. [phone] female, currently [redacted]w[redacted]d pregnant, who presents today to establish care.  Initial concerns:***  Past medical history: ADHD, migraine with aura, possible suicidal behavior (per chart review, "taking a bunch of pills". Recently evaluated for this last month)  Past surgical history:***  Current medications:Ferrous sulfate, prenatal vitamins  Family history:***  Social history:***  ROS: pertinent noted in the HPI   Objective:  LMP 08/23/2020  ***  Vitals and nursing note reviewed  General: NAD, pleasant, able to participate in exam Cardiac: RRR, S1 S2 present. normal heart sounds, no murmurs. Respiratory: CTAB, normal effort, No wheezes, rales or rhonchi Abdomen: Bowel sounds present, non-tender, non-distended, no hepatosplenomegaly Extremities: no edema or cyanosis. Skin: warm and dry, no rashes noted Neuro: alert, no obvious focal deficits Psych: Normal affect and mood   Assessment & Plan:    No problem-specific Assessment & Plan notes found for this encounter.  Health Maintenance Up to date on Pap smear (May 2022- NILM). Offered Flu and COVID vaccines today and ***.   Sabino Dick, DO Family Medicine Resident

## 2021-04-07 ENCOUNTER — Ambulatory Visit: Payer: Medicaid Other | Admitting: Family Medicine

## 2021-04-13 ENCOUNTER — Other Ambulatory Visit: Payer: Self-pay | Admitting: *Deleted

## 2021-04-13 ENCOUNTER — Encounter: Payer: Self-pay | Admitting: *Deleted

## 2021-04-13 ENCOUNTER — Ambulatory Visit: Payer: Medicaid Other | Admitting: *Deleted

## 2021-04-13 ENCOUNTER — Ambulatory Visit: Payer: Medicaid Other | Attending: Maternal & Fetal Medicine

## 2021-04-13 ENCOUNTER — Other Ambulatory Visit: Payer: Self-pay

## 2021-04-13 VITALS — BP 110/59 | HR 75

## 2021-04-13 DIAGNOSIS — Z3493 Encounter for supervision of normal pregnancy, unspecified, third trimester: Secondary | ICD-10-CM

## 2021-04-13 DIAGNOSIS — Z3A32 32 weeks gestation of pregnancy: Secondary | ICD-10-CM | POA: Diagnosis not present

## 2021-04-13 DIAGNOSIS — O358XX0 Maternal care for other (suspected) fetal abnormality and damage, not applicable or unspecified: Secondary | ICD-10-CM

## 2021-04-13 DIAGNOSIS — Z362 Encounter for other antenatal screening follow-up: Secondary | ICD-10-CM

## 2021-04-13 DIAGNOSIS — Q210 Ventricular septal defect: Secondary | ICD-10-CM | POA: Insufficient documentation

## 2021-04-20 ENCOUNTER — Encounter: Payer: Self-pay | Admitting: Obstetrics and Gynecology

## 2021-04-20 ENCOUNTER — Ambulatory Visit (INDEPENDENT_AMBULATORY_CARE_PROVIDER_SITE_OTHER): Payer: Medicaid Other | Admitting: Obstetrics and Gynecology

## 2021-04-20 ENCOUNTER — Other Ambulatory Visit: Payer: Self-pay

## 2021-04-20 VITALS — BP 113/75 | HR 81 | Wt 180.7 lb

## 2021-04-20 DIAGNOSIS — O35BXX0 Maternal care for other (suspected) fetal abnormality and damage, fetal cardiac anomalies, not applicable or unspecified: Secondary | ICD-10-CM

## 2021-04-20 DIAGNOSIS — Z6791 Unspecified blood type, Rh negative: Secondary | ICD-10-CM

## 2021-04-20 DIAGNOSIS — O26899 Other specified pregnancy related conditions, unspecified trimester: Secondary | ICD-10-CM

## 2021-04-20 DIAGNOSIS — Z3493 Encounter for supervision of normal pregnancy, unspecified, third trimester: Secondary | ICD-10-CM

## 2021-04-20 NOTE — Patient Instructions (Signed)

## 2021-04-20 NOTE — Progress Notes (Signed)
Subjective:  Laurie Hansen is a 25 y.o. [phone] at [redacted]w[redacted]d being seen today for ongoing prenatal care.  She is currently monitored for the following issues for this low-risk pregnancy and has Migraine with aura, without mention of intractable migraine without mention of status migrainosus; Attention deficit hyperactivity disorder (ADHD); Supervision of low-risk pregnancy; Suicidal behavior; Rh negative state in antepartum period; and Abnormal fetal echocardiogram affecting antepartum care of mother on their problem list.  Patient reports general discomforts of pregnancy.  Contractions: Not present. Vag. Bleeding: None.  Movement: Present. Denies leaking of fluid.   The following portions of the patient's history were reviewed and updated as appropriate: allergies, current medications, past family history, past medical history, past social history, past surgical history and problem list. Problem list updated.  Objective:   Vitals:   04/20/21 0905  BP: 113/75  Pulse: 81  Weight: 180 lb 11.2 oz (82 kg)    Fetal Status: Fetal Heart Rate (bpm): 154   Movement: Present     General:  Alert, oriented and cooperative. Patient is in no acute distress.  Skin: Skin is warm and dry. No rash noted.   Cardiovascular: Normal heart rate noted  Respiratory: Normal respiratory effort, no problems with respiration noted  Abdomen: Soft, gravid, appropriate for gestational age. Pain/Pressure: Absent     Pelvic:  Cervical exam deferred        Extremities: Normal range of motion.  Edema: Trace  Mental Status: Normal mood and affect. Normal behavior. Normal judgment and thought content.   Urinalysis:      Assessment and Plan:  Pregnancy: G4P1021 at [redacted]w[redacted]d  1. Encounter for supervision of low-risk pregnancy in third trimester Stable Growth scan later this month  2. Rh negative state in antepartum period S/P Rhogam  3. Abnormal fetal echocardiography affecting antepartum care of mother, single or  unspecified fetus Follow with peds cards this month  Preterm labor symptoms and general obstetric precautions including but not limited to vaginal bleeding, contractions, leaking of fluid and fetal movement were reviewed in detail with the patient. Please refer to After Visit Summary for other counseling recommendations.  Return in about 2 weeks (around 05/04/2021) for OB visit, face to face, any provider.   Hermina Staggers, MD

## 2021-05-11 ENCOUNTER — Other Ambulatory Visit: Payer: Self-pay

## 2021-05-11 ENCOUNTER — Other Ambulatory Visit (HOSPITAL_COMMUNITY)
Admission: RE | Admit: 2021-05-11 | Discharge: 2021-05-11 | Disposition: A | Discharge status: Home / Self Care | Payer: Medicaid Other | Source: Ambulatory Visit | Attending: Family Medicine | Admitting: Family Medicine

## 2021-05-11 ENCOUNTER — Ambulatory Visit (INDEPENDENT_AMBULATORY_CARE_PROVIDER_SITE_OTHER): Payer: Medicaid Other | Admitting: Family Medicine

## 2021-05-11 ENCOUNTER — Encounter: Payer: Self-pay | Admitting: Family Medicine

## 2021-05-11 VITALS — BP 103/66 | HR 82 | Wt 182.7 lb

## 2021-05-11 DIAGNOSIS — Z3A36 36 weeks gestation of pregnancy: Secondary | ICD-10-CM | POA: Insufficient documentation

## 2021-05-11 DIAGNOSIS — Z3493 Encounter for supervision of normal pregnancy, unspecified, third trimester: Secondary | ICD-10-CM

## 2021-05-11 DIAGNOSIS — O35BXX0 Maternal care for other (suspected) fetal abnormality and damage, fetal cardiac anomalies, not applicable or unspecified: Secondary | ICD-10-CM | POA: Insufficient documentation

## 2021-05-11 DIAGNOSIS — Z6791 Unspecified blood type, Rh negative: Secondary | ICD-10-CM | POA: Diagnosis not present

## 2021-05-11 DIAGNOSIS — O26899 Other specified pregnancy related conditions, unspecified trimester: Secondary | ICD-10-CM

## 2021-05-11 NOTE — Progress Notes (Signed)
    Subjective:  Laurie Hansen is a 25 y.o. [phone] at [redacted]w[redacted]d being seen today for ongoing prenatal care.  She is currently monitored for the following issues for this high-risk pregnancy and has Migraine with aura, without mention of intractable migraine without mention of status migrainosus; Attention deficit hyperactivity disorder (ADHD); Supervision of low-risk pregnancy; Suicidal behavior; Rh negative state in antepartum period; and Abnormal fetal echocardiogram affecting antepartum care of mother on their problem list.  Patient reports no complaints.  Contractions: Not present. Vag. Bleeding: None.  Movement: Present. Denies leaking of fluid.   The following portions of the patient's history were reviewed and updated as appropriate: allergies, current medications, past family history, past medical history, past social history, past surgical history and problem list.   Objective:   Vitals:   05/11/21 0848  BP: 103/66  Pulse: 82  Weight: 182 lb 11.2 oz (82.9 kg)    Fetal Status: Fetal Heart Rate (bpm): 145 Fundal Height: 36 cm Movement: Present  Presentation: Vertex  General:  Alert, oriented and cooperative. Patient is in no acute distress.  Skin: Skin is warm and dry. No rash noted.   Cardiovascular: Normal heart rate noted  Respiratory: Normal respiratory effort, no problems with respiration noted  Abdomen: Soft, gravid, appropriate for gestational age. Pain/Pressure: Absent     Pelvic:  Cervical exam deferred        Extremities: Normal range of motion.  Edema: None  Mental Status: Normal mood and affect. Normal behavior. Normal judgment and thought content.   Urinalysis:      Assessment and Plan:  Pregnancy: G4P1021 at [redacted]w[redacted]d  1. Encounter for supervision of low-risk pregnancy in third trimester Doing well, active fetal movement. Cephalic by leopold's and ipad [place].   2. [redacted] weeks gestation of pregnancy Gc/Ch and GBS collected today.   3. Rh negative state in antepartum  period RhoGAM given at 28 weeks, eval postpartum.   4. 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 No change in delivery plan.   Preterm labor symptoms and general obstetric precautions including but not limited to vaginal bleeding, contractions, leaking of fluid and fetal movement were reviewed in detail with the patient. Please refer to After Visit Summary for other counseling recommendations.   Return in about 1 week (around 05/18/2021) for LROB.   Allayne Stack, DO

## 2021-05-11 NOTE — Progress Notes (Signed)
Patient stated that she is feeling fine and has no questions or concerns  

## 2021-05-12 ENCOUNTER — Ambulatory Visit: Payer: Medicaid Other | Admitting: *Deleted

## 2021-05-12 ENCOUNTER — Encounter: Payer: Self-pay | Admitting: *Deleted

## 2021-05-12 ENCOUNTER — Ambulatory Visit: Payer: Medicaid Other | Attending: Obstetrics and Gynecology

## 2021-05-12 VITALS — BP 110/68 | HR 79

## 2021-05-12 DIAGNOSIS — Z3493 Encounter for supervision of normal pregnancy, unspecified, third trimester: Secondary | ICD-10-CM

## 2021-05-12 DIAGNOSIS — Q210 Ventricular septal defect: Secondary | ICD-10-CM | POA: Insufficient documentation

## 2021-05-12 DIAGNOSIS — Z3A36 36 weeks gestation of pregnancy: Secondary | ICD-10-CM | POA: Diagnosis not present

## 2021-05-12 DIAGNOSIS — O358XX0 Maternal care for other (suspected) fetal abnormality and damage, not applicable or unspecified: Secondary | ICD-10-CM

## 2021-05-12 DIAGNOSIS — Z362 Encounter for other antenatal screening follow-up: Secondary | ICD-10-CM | POA: Diagnosis not present

## 2021-05-12 LAB — CERVICOVAGINAL ANCILLARY ONLY
Chlamydia: NEGATIVE
Comment: NEGATIVE
Comment: NORMAL
Neisseria Gonorrhea: NEGATIVE

## 2021-05-15 LAB — CULTURE, BETA STREP (GROUP B ONLY): Strep Gp B Culture: NEGATIVE

## 2021-05-20 ENCOUNTER — Other Ambulatory Visit: Payer: Self-pay

## 2021-05-20 ENCOUNTER — Ambulatory Visit (INDEPENDENT_AMBULATORY_CARE_PROVIDER_SITE_OTHER): Payer: Medicaid Other

## 2021-05-20 VITALS — BP 130/70 | HR 102 | Wt 188.8 lb

## 2021-05-20 DIAGNOSIS — O26899 Other specified pregnancy related conditions, unspecified trimester: Secondary | ICD-10-CM

## 2021-05-20 DIAGNOSIS — Z3493 Encounter for supervision of normal pregnancy, unspecified, third trimester: Secondary | ICD-10-CM

## 2021-05-20 DIAGNOSIS — O35BXX0 Maternal care for other (suspected) fetal abnormality and damage, fetal cardiac anomalies, not applicable or unspecified: Secondary | ICD-10-CM

## 2021-05-20 DIAGNOSIS — Z6791 Unspecified blood type, Rh negative: Secondary | ICD-10-CM

## 2021-05-20 DIAGNOSIS — Z3A38 38 weeks gestation of pregnancy: Secondary | ICD-10-CM

## 2021-05-20 NOTE — Progress Notes (Signed)
   PRENATAL VISIT NOTE  Subjective:  Laurie Hansen is a 25 y.o. [phone] at [redacted]w[redacted]d being seen today for ongoing prenatal care.  She is currently monitored for the following issues for this low-risk pregnancy and has Migraine with aura, without mention of intractable migraine without mention of status migrainosus; Attention deficit hyperactivity disorder (ADHD); Supervision of low-risk pregnancy; Rh negative state in antepartum period; and Abnormal fetal echocardiogram affecting antepartum care of mother on their problem list.  Patient reports no complaints.  Contractions: Irritability. Vag. Bleeding: None.  Movement: Present. Denies leaking of fluid.   The following portions of the patient's history were reviewed and updated as appropriate: allergies, current medications, past family history, past medical history, past social history, past surgical history and problem list.   Objective:   Vitals:   05/20/21 1019  BP: 130/70  Pulse: (!) 102  Weight: 188 lb 12.8 oz (85.6 kg)    Fetal Status: Fetal Heart Rate (bpm): 152 Fundal Height: 38 cm Movement: Present     General:  Alert, oriented and cooperative. Patient is in no acute distress.  Skin: Skin is warm and dry. No rash noted.   Cardiovascular: Normal heart rate noted  Respiratory: Normal respiratory effort, no problems with respiration noted  Abdomen: Soft, gravid, appropriate for gestational age.  Pain/Pressure: Present     Pelvic: Cervical exam performed in the presence of a chaperone Dilation: 1 Effacement (%): 50 Station: -3  Extremities: Normal range of motion.  Edema: None  Mental Status: Normal mood and affect. Normal behavior. Normal judgment and thought content.   Assessment and Plan:  Pregnancy: G4P1021 at [redacted]w[redacted]d  1. Encounter for supervision of low-risk pregnancy in third trimester - Routine OB care - Doing well. No concerns. Requesting cervical exam - Anticipatory guidance for upcoming appointments provided  2. [redacted]  weeks gestation of pregnancy - Desires to go into labor. Discussed EPO, RRT, and [employer]  3. Rh negative state in antepartum period - s/p Rhogam  4. Abnormal fetal echocardiography affecting antepartum care of mother, single or unspecified fetus - S/p fetal echo. Peds to f/u PP   Term labor symptoms and general obstetric precautions including but not limited to vaginal bleeding, contractions, leaking of fluid and fetal movement were reviewed in detail with the patient. Please refer to After Visit Summary for other counseling recommendations.   Return in about 1 week (around 05/27/2021).  Future Appointments  Date Time Provider Department Center  05/27/2021 10:15 AM Worthy Rancher, MD [facility]    Brand Males, CNM 05/20/21 10:48 AM

## 2021-05-20 NOTE — Patient Instructions (Signed)
[email or web address] 

## 2021-05-27 ENCOUNTER — Ambulatory Visit (INDEPENDENT_AMBULATORY_CARE_PROVIDER_SITE_OTHER): Payer: Medicaid Other | Admitting: Family Medicine

## 2021-05-27 ENCOUNTER — Other Ambulatory Visit: Payer: Self-pay

## 2021-05-27 VITALS — BP 122/74 | HR 113 | Wt 186.7 lb

## 2021-05-27 DIAGNOSIS — Z3A39 39 weeks gestation of pregnancy: Secondary | ICD-10-CM

## 2021-05-27 DIAGNOSIS — Z3493 Encounter for supervision of normal pregnancy, unspecified, third trimester: Secondary | ICD-10-CM

## 2021-05-27 DIAGNOSIS — O35BXX0 Maternal care for other (suspected) fetal abnormality and damage, fetal cardiac anomalies, not applicable or unspecified: Secondary | ICD-10-CM

## 2021-05-27 DIAGNOSIS — O26899 Other specified pregnancy related conditions, unspecified trimester: Secondary | ICD-10-CM

## 2021-05-27 DIAGNOSIS — Z6791 Unspecified blood type, Rh negative: Secondary | ICD-10-CM

## 2021-05-27 NOTE — Progress Notes (Signed)
   PRENATAL VISIT NOTE  Subjective:  Laurie Hansen is a 25 y.o. [phone] at [redacted]w[redacted]d being seen today for ongoing prenatal care.  She is currently monitored for the following issues for this low-risk pregnancy and has Migraine with aura, without mention of intractable migraine without mention of status migrainosus; Attention deficit hyperactivity disorder (ADHD); Supervision of low-risk pregnancy; Rh negative state in antepartum period; and Abnormal fetal echocardiogram affecting antepartum care of mother on their problem list.  Patient reports occasional vaginal pressure . Contractions: Irritability. Vag. Bleeding: None.  Movement: Present. Denies leaking of fluid.   The following portions of the patient's history were reviewed and updated as appropriate: allergies, current medications, past family history, past medical history, past social history, past surgical history and problem list.   Objective:   Vitals:   05/27/21 1034  BP: 122/74  Pulse: (!) 113  Weight: 186 lb 11.2 oz (84.7 kg)    Fetal Status: Fetal Heart Rate (bpm): 145 Fundal Height: 39 cm Movement: Present  General:  Alert, oriented and cooperative. Patient is in no acute distress.  Skin: Skin is warm and dry. No rash noted.   Cardiovascular: Normal heart rate noted.  Respiratory: Normal respiratory effort, no problems with respiration noted.  Abdomen: Soft, gravid, appropriate for gestational age.     Pelvic: Cervical exam performed in the presence of a chaperone. SVE 1/thick/-3.   Extremities: Normal range of motion.  Edema: Trace.  Mental Status: Normal mood and affect. Normal behavior. Normal judgment and thought content.   Assessment and Plan:  Pregnancy: G4P1021 at [redacted]w[redacted]d  1. Encounter for supervision of low-risk pregnancy in third trimester 2. [redacted] weeks gestation of pregnancy Progressing well. FH and FHT within normal limits. Negative GBS. SVE 1/thick/-3 today in office. Hoping to go into labor on her own. Has  follow up OB visit scheduled in 1 week. Labor precautions reviewed.  3. Rh negative state in antepartum period Rhogam given 03/18/21. Will obtain Rhogam eval postpartum.   4. Abnormal fetal echocardiography affecting antepartum care of mother, single or unspecified fetus VSD noted. Will have follow up with pediatrics postpartum.  Term labor symptoms and general obstetric precautions including but not limited to vaginal bleeding, contractions, leaking of fluid and fetal movement were reviewed in detail with the patient.  Please refer to After Visit Summary for other counseling recommendations.   Return in about 1 week (around 06/03/2021) for follow up visit as scheduled.  Future Appointments  Date Time Provider Department Center  06/03/2021  3:55 PM Constant, Gigi Gin, MD [facility]   Worthy Rancher, MD

## 2021-05-28 ENCOUNTER — Encounter: Payer: Self-pay | Admitting: Family Medicine

## 2021-06-02 ENCOUNTER — Encounter (HOSPITAL_COMMUNITY): Payer: Self-pay | Admitting: Obstetrics and Gynecology

## 2021-06-02 ENCOUNTER — Other Ambulatory Visit: Payer: Self-pay

## 2021-06-02 ENCOUNTER — Inpatient Hospital Stay (HOSPITAL_COMMUNITY)
Admission: AD | Admit: 2021-06-02 | Discharge: 2021-06-03 | DRG: 807 | Disposition: A | Discharge status: Home / Self Care | Payer: Medicaid Other | Attending: Obstetrics and Gynecology | Admitting: Obstetrics and Gynecology

## 2021-06-02 DIAGNOSIS — D509 Iron deficiency anemia, unspecified: Secondary | ICD-10-CM | POA: Diagnosis present

## 2021-06-02 DIAGNOSIS — O35BXX0 Maternal care for other (suspected) fetal abnormality and damage, fetal cardiac anomalies, not applicable or unspecified: Secondary | ICD-10-CM | POA: Diagnosis present

## 2021-06-02 DIAGNOSIS — Z20822 Contact with and (suspected) exposure to covid-19: Secondary | ICD-10-CM | POA: Diagnosis present

## 2021-06-02 DIAGNOSIS — Z6791 Unspecified blood type, Rh negative: Secondary | ICD-10-CM

## 2021-06-02 DIAGNOSIS — Z3A39 39 weeks gestation of pregnancy: Secondary | ICD-10-CM

## 2021-06-02 DIAGNOSIS — O9902 Anemia complicating childbirth: Secondary | ICD-10-CM | POA: Diagnosis present

## 2021-06-02 DIAGNOSIS — O26899 Other specified pregnancy related conditions, unspecified trimester: Secondary | ICD-10-CM

## 2021-06-02 DIAGNOSIS — O479 False labor, unspecified: Secondary | ICD-10-CM | POA: Diagnosis present

## 2021-06-02 DIAGNOSIS — O26893 Other specified pregnancy related conditions, third trimester: Secondary | ICD-10-CM | POA: Diagnosis present

## 2021-06-02 DIAGNOSIS — Z3493 Encounter for supervision of normal pregnancy, unspecified, third trimester: Secondary | ICD-10-CM

## 2021-06-02 LAB — CBC
HCT: 32.5 % — ABNORMAL LOW (ref 36.0–46.0)
HCT: 32 % — ABNORMAL LOW (ref 36.0–46.0)
Hemoglobin: 10.1 g/dL — ABNORMAL LOW (ref 12.0–15.0)
Hemoglobin: 9.9 g/dL — ABNORMAL LOW (ref 12.0–15.0)
MCH: 24.8 pg — ABNORMAL LOW (ref 26.0–34.0)
MCH: 24.4 pg — ABNORMAL LOW (ref 26.0–34.0)
MCHC: 31.1 g/dL (ref 30.0–36.0)
MCHC: 30.9 g/dL (ref 30.0–36.0)
MCV: 79.7 fL — ABNORMAL LOW (ref 80.0–100.0)
MCV: 79 fL — ABNORMAL LOW (ref 80.0–100.0)
Platelets: 208 10*3/uL (ref 150–400)
Platelets: 186 10*3/uL (ref 150–400)
RBC: 4.08 MIL/uL (ref 3.87–5.11)
RBC: 4.05 MIL/uL (ref 3.87–5.11)
RDW: 15 % (ref 11.5–15.5)
RDW: 15.1 % (ref 11.5–15.5)
WBC: 6.6 10*3/uL (ref 4.0–10.5)
WBC: 11.5 10*3/uL — ABNORMAL HIGH (ref 4.0–10.5)
nRBC: 0 % (ref 0.0–0.2)
nRBC: 0 % (ref 0.0–0.2)

## 2021-06-02 LAB — TYPE AND SCREEN
ABO/RH(D): A NEG
Antibody Screen: POSITIVE

## 2021-06-02 LAB — RPR: RPR Ser Ql: NONREACTIVE

## 2021-06-02 LAB — RESP PANEL BY RT-PCR (FLU A&B, COVID) ARPGX2
Influenza A by PCR: NEGATIVE
Influenza B by PCR: NEGATIVE
SARS Coronavirus 2 by RT PCR: NEGATIVE

## 2021-06-02 MED ORDER — SIMETHICONE 80 MG PO CHEW
80.0000 mg | CHEWABLE_TABLET | ORAL | Status: DC | PRN
  Filled 2021-06-02: qty 1

## 2021-06-02 MED ORDER — FENTANYL-BUPIVACAINE-NACL 0.5-0.125-0.9 MG/250ML-% EP SOLN: 12.0000 mL/h | EPIDURAL | Status: DC | PRN

## 2021-06-02 MED ORDER — FENTANYL CITRATE (PF) 100 MCG/2ML IJ SOLN
100.0000 ug | Freq: Once | INTRAMUSCULAR | Status: AC
  Administered 2021-06-02: 100 ug via INTRAVENOUS

## 2021-06-02 MED ORDER — EPHEDRINE 5 MG/ML INJ: 10.0000 mg | INTRAVENOUS | Status: DC | PRN

## 2021-06-02 MED ORDER — DIBUCAINE (PERIANAL) 1 % EX OINT: 1.0000 "application " | TOPICAL_OINTMENT | CUTANEOUS | Status: DC | PRN

## 2021-06-02 MED ORDER — IBUPROFEN 600 MG PO TABS
600.0000 mg | ORAL_TABLET | Freq: Four times a day (QID) | ORAL | Status: DC
  Administered 2021-06-02 – 2021-06-03 (×4): 600 mg via ORAL
  Filled 2021-06-02 (×5): qty 1

## 2021-06-02 MED ORDER — TRANEXAMIC ACID-NACL 1000-0.7 MG/100ML-% IV SOLN
1000.0000 mg | INTRAVENOUS | Status: AC
  Administered 2021-06-02: 1000 mg via INTRAVENOUS

## 2021-06-02 MED ORDER — LACTATED RINGERS IV SOLN: INTRAVENOUS | Status: DC

## 2021-06-02 MED ORDER — OXYTOCIN-SODIUM CHLORIDE 30-0.9 UT/500ML-% IV SOLN
2.5000 [IU]/h | INTRAVENOUS | Status: DC
  Filled 2021-06-02: qty 500

## 2021-06-02 MED ORDER — ONDANSETRON HCL 4 MG/2ML IJ SOLN: 4.0000 mg | Freq: Four times a day (QID) | INTRAMUSCULAR | Status: DC | PRN

## 2021-06-02 MED ORDER — DIPHENHYDRAMINE HCL 50 MG/ML IJ SOLN: 12.5000 mg | INTRAMUSCULAR | Status: DC | PRN

## 2021-06-02 MED ORDER — TETANUS-DIPHTH-ACELL PERTUSSIS 5-2.5-18.5 LF-MCG/0.5 IM SUSY: 0.5000 mL | PREFILLED_SYRINGE | Freq: Once | INTRAMUSCULAR | Status: DC

## 2021-06-02 MED ORDER — SENNOSIDES-DOCUSATE SODIUM 8.6-50 MG PO TABS
2.0000 | ORAL_TABLET | Freq: Every day | ORAL | Status: DC
  Administered 2021-06-03 (×2): 2 via ORAL
  Filled 2021-06-02: qty 2

## 2021-06-02 MED ORDER — OXYCODONE-ACETAMINOPHEN 5-325 MG PO TABS: 2.0000 | ORAL_TABLET | ORAL | Status: DC | PRN

## 2021-06-02 MED ORDER — PHENYLEPHRINE 40 MCG/ML (10ML) SYRINGE FOR IV PUSH (FOR BLOOD PRESSURE SUPPORT): 80.0000 ug | PREFILLED_SYRINGE | INTRAVENOUS | Status: DC | PRN

## 2021-06-02 MED ORDER — ZOLPIDEM TARTRATE 5 MG PO TABS: 5.0000 mg | ORAL_TABLET | Freq: Every evening | ORAL | Status: DC | PRN

## 2021-06-02 MED ORDER — METHYLERGONOVINE MALEATE 0.2 MG/ML IJ SOLN
INTRAMUSCULAR | Status: AC
  Filled 2021-06-02: qty 1

## 2021-06-02 MED ORDER — PRENATAL MULTIVITAMIN CH
1.0000 | ORAL_TABLET | Freq: Every day | ORAL | Status: DC
  Administered 2021-06-03: 11:00:00 1 via ORAL
  Filled 2021-06-02: qty 1

## 2021-06-02 MED ORDER — COCONUT OIL OIL: 1.0000 "application " | TOPICAL_OIL | Status: DC | PRN

## 2021-06-02 MED ORDER — ACETAMINOPHEN 325 MG PO TABS
650.0000 mg | ORAL_TABLET | ORAL | Status: DC | PRN
  Administered 2021-06-03: 11:00:00 650 mg via ORAL
  Filled 2021-06-02: qty 2

## 2021-06-02 MED ORDER — WITCH HAZEL-GLYCERIN EX PADS: 1.0000 "application " | MEDICATED_PAD | CUTANEOUS | Status: DC | PRN

## 2021-06-02 MED ORDER — TRANEXAMIC ACID-NACL 1000-0.7 MG/100ML-% IV SOLN
INTRAVENOUS | Status: AC
  Filled 2021-06-02: qty 100

## 2021-06-02 MED ORDER — ONDANSETRON HCL 4 MG PO TABS: 4.0000 mg | ORAL_TABLET | ORAL | Status: DC | PRN

## 2021-06-02 MED ORDER — BENZOCAINE-MENTHOL 20-0.5 % EX AERO
1.0000 "application " | INHALATION_SPRAY | CUTANEOUS | Status: DC | PRN
  Administered 2021-06-02: 1 via TOPICAL
  Filled 2021-06-02: qty 56

## 2021-06-02 MED ORDER — OXYTOCIN BOLUS FROM INFUSION
333.0000 mL | Freq: Once | INTRAVENOUS | Status: AC
  Administered 2021-06-02: 333 mL via INTRAVENOUS

## 2021-06-02 MED ORDER — ACETAMINOPHEN 325 MG PO TABS: 650.0000 mg | ORAL_TABLET | ORAL | Status: DC | PRN

## 2021-06-02 MED ORDER — LACTATED RINGERS IV SOLN: 500.0000 mL | INTRAVENOUS | Status: DC | PRN

## 2021-06-02 MED ORDER — METHYLERGONOVINE MALEATE 0.2 MG/ML IJ SOLN
0.2000 mg | Freq: Once | INTRAMUSCULAR | Status: AC
  Administered 2021-06-02: 0.2 mg via INTRAMUSCULAR

## 2021-06-02 MED ORDER — FENTANYL CITRATE (PF) 100 MCG/2ML IJ SOLN
INTRAMUSCULAR | Status: AC
  Filled 2021-06-02: qty 2

## 2021-06-02 MED ORDER — SOD CITRATE-CITRIC ACID 500-334 MG/5ML PO SOLN: 30.0000 mL | ORAL | Status: DC | PRN

## 2021-06-02 MED ORDER — OXYCODONE-ACETAMINOPHEN 5-325 MG PO TABS: 1.0000 | ORAL_TABLET | ORAL | Status: DC | PRN

## 2021-06-02 MED ORDER — ONDANSETRON HCL 4 MG/2ML IJ SOLN: 4.0000 mg | INTRAMUSCULAR | Status: DC | PRN

## 2021-06-02 MED ORDER — LIDOCAINE HCL (PF) 1 % IJ SOLN: 30.0000 mL | INTRAMUSCULAR | Status: DC | PRN

## 2021-06-02 MED ORDER — DIPHENHYDRAMINE HCL 25 MG PO CAPS: 25.0000 mg | ORAL_CAPSULE | Freq: Four times a day (QID) | ORAL | Status: DC | PRN

## 2021-06-02 MED ORDER — LACTATED RINGERS IV SOLN: 500.0000 mL | Freq: Once | INTRAVENOUS | Status: DC

## 2021-06-02 NOTE — MAU Note (Signed)
PT SAYS STRONG UC SINCE 10PM PNC WITH CLINIC  VE LAST WEEK - 1 CM DENIES HSV  GBS- NEG

## 2021-06-02 NOTE — Discharge Summary (Signed)
Postpartum Discharge Summary  Date of Service updated     Patient Name: Laurie Hansen DOB: Nov 28, 1995 MRN: [id number]  Date of admission: 06/02/2021 Delivery date:06/02/2021  Delivering provider: Wende Mott  Date of discharge: 06/03/2021  Admitting diagnosis: Uterine contractions during pregnancy [O47.9] Intrauterine pregnancy: [redacted]w[redacted]d     Secondary diagnosis:  Active Problems:   Rh negative state in antepartum period   Abnormal fetal echocardiogram affecting antepartum care of mother   Uterine contractions during pregnancy  Additional problems: iron deficiency anemia     Discharge diagnosis: Term Pregnancy Delivered                                              Post partum procedures:rhogam Augmentation: N/A Complications: None  Hospital course: Onset of Labor With Vaginal Delivery      25 y.o. yo [id number] at [redacted]w[redacted]d was admitted in Active Labor on 06/02/2021. Patient had an uncomplicated labor course as follows: progressed to complete without augmentation and SROM immediately before delivery.  Membrane Rupture Time/Date: 3:13 AM ,06/02/2021   Delivery Method:Vaginal, Spontaneous  Episiotomy: None  Lacerations:  None  Patient had an uncomplicated postpartum course.  She is ambulating, tolerating a regular diet, passing flatus, and urinating well. Patient is discharged home in stable condition on 06/03/21.  Newborn Data: Birth date:06/02/2021  Birth time:3:32 AM  Gender:Female  Living status:Living  Apgars:9 ,9  Weight:3600 g   Magnesium Sulfate received: No BMZ received: No Rhophylac:Yes (infant RH pos)  MMR:No T-DaP:Given prenatally Flu: No Transfusion:No  Physical exam  Vitals:   06/02/21 1355 06/02/21 1733 06/02/21 2100 06/03/21 0540  BP: (P) 113/71 94/60 101/65 103/64  Pulse: (P) 80 79 98 74  Resp:  $Remo'20 18 18  'nzqob$ Temp: (P) 98.7 F (37.1 C) 98.9 F (37.2 C) 98.9 F (37.2 C) 98.7 F (37.1 C)  TempSrc:   Oral Oral  SpO2: (P) 100%  99% 100%   Weight:      Height:       General: alert, cooperative, and no distress Lochia: appropriate Uterine Fundus: firm Incision: N/A DVT Evaluation: No significant calf/ankle edema. Labs: Lab Results  Component Value Date   WBC 11.5 (H) 06/02/2021   HGB 9.9 (L) 06/02/2021   HCT 32.0 (L) 06/02/2021   MCV 79.0 (L) 06/02/2021   PLT 186 06/02/2021   CMP Latest Ref Rng & Units 01/09/2021  Glucose 70 - 99 mg/dL 80  BUN 6 - 20 mg/dL <5(L)  Creatinine 0.44 - 1.00 mg/dL 0.51  Sodium 135 - 145 mmol/L 135  Potassium 3.5 - 5.1 mmol/L 3.6  Chloride 98 - 111 mmol/L 104  CO2 22 - 32 mmol/L 23  Calcium 8.9 - 10.3 mg/dL 8.8(L)  Total Protein 6.5 - 8.1 g/dL 6.5  Total Bilirubin 0.3 - 1.2 mg/dL 0.6  Alkaline Phos 38 - 126 U/L 57  AST 15 - 41 U/L 15  ALT 0 - 44 U/L 11   Edinburgh Score: Edinburgh Postnatal Depression Scale Screening Tool 01/04/2018  I have been able to laugh and see the funny side of things. 0  I have looked forward with enjoyment to things. 0  I have blamed myself unnecessarily when things went wrong. 0  I have been anxious or worried for no good reason. 0  I have felt scared or panicky for no good reason. 0  Things have  been getting on top of me. 0  I have been so unhappy that I have had difficulty sleeping. 0  I have felt sad or miserable. 0  I have been so unhappy that I have been crying. 0  The thought of harming myself has occurred to me. 0  Edinburgh Postnatal Depression Scale Total 0     After visit meds:  Allergies as of 06/03/2021   No Known Allergies      Medication List     TAKE these medications    acetaminophen 325 MG tablet Commonly known as: Tylenol Take 2 tablets (650 mg total) by mouth every 4 (four) hours as needed (for pain scale < 4).   ferrous sulfate 325 (65 FE) MG tablet Take 1 tablet (325 mg total) by mouth every other day.   ibuprofen 600 MG tablet Commonly known as: ADVIL Take 1 tablet (600 mg total) by mouth every 6 (six) hours as  needed.   norethindrone 0.35 MG tablet Commonly known as: Ortho Micronor Take 1 tablet (0.35 mg total) by mouth daily. Start taking on: June 24, 2021   PRENATAL GUMMIES PO Take 1 tablet by mouth daily.         Discharge home in stable condition Infant Feeding: Breast Infant Disposition: Hopefully home with mother Discharge instruction: per After Visit Summary and Postpartum booklet. Activity: Advance as tolerated. Pelvic rest for 6 weeks.  Diet: routine diet Future Appointments: Future Appointments  Date Time Provider [place]  07/08/2021  1:15 PM Griffin Basil, MD [facility]   Follow up Visit:  Please schedule this patient for a In person postpartum visit in 4 weeks with the following provider: Any provider. Additional Postpartum F/U: n/a   Low risk pregnancy complicated by:  n/a Delivery mode:  Vaginal, Spontaneous  Anticipated Birth Control:  POPs   06/03/2021 Patriciaann Clan, DO
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
## 2021-06-02 NOTE — Lactation Note (Signed)
This note was copied from a baby's chart. Lactation Consultation Note  Patient Name: Girl Ivry Pigue HWKGS'U Date: 06/02/2021   Age:23 hours  Initial visit at 5 hours of life. LC visit done by Eilene Ghazi, lactation student & myself. Infant sleeping during consult; formula noted at bedside.   Education done; hand pump provided with instructions for use. Mom educated that, ideally, she should attempt to express her milk when infant receives formula to maximize her supply.   Mom was made aware of O/P services, breastfeeding support groups, community resources, and our phone # for post-discharge questions. Mom's questions were answered to her satisfaction & knows she can call for assist as desired.   Lurline Hare [facility] 06/02/2021, 8:39 AM

## 2021-06-02 NOTE — Progress Notes (Signed)
CNM called back to bedside to assess bleeding. Uterus firm, 2 below but steady trickle of bright red bleeding noted. Will give methergine and have patient empty bladder, then reassess.   Rolm Bookbinder, CNM 06/02/21 5:13 AM

## 2021-06-03 ENCOUNTER — Telehealth (HOSPITAL_COMMUNITY): Payer: Self-pay | Admitting: *Deleted

## 2021-06-03 ENCOUNTER — Encounter: Payer: Medicaid Other | Admitting: Obstetrics and Gynecology

## 2021-06-03 MED ORDER — NORETHINDRONE 0.35 MG PO TABS: 1.0000 | ORAL_TABLET | Freq: Every day | ORAL | 2 refills | Status: DC

## 2021-06-03 MED ORDER — IBUPROFEN 600 MG PO TABS: 600.0000 mg | ORAL_TABLET | Freq: Four times a day (QID) | ORAL | 0 refills | Status: DC | PRN

## 2021-06-03 MED ORDER — RHO D IMMUNE GLOBULIN 1500 UNIT/2ML IJ SOSY
300.0000 ug | PREFILLED_SYRINGE | Freq: Once | INTRAMUSCULAR | Status: AC
  Administered 2021-06-03: 13:00:00 300 ug via INTRAVENOUS
  Filled 2021-06-03: qty 2

## 2021-06-03 MED ORDER — ACETAMINOPHEN 325 MG PO TABS: 650.0000 mg | ORAL_TABLET | ORAL | Status: DC | PRN
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
## 2021-06-03 NOTE — Progress Notes (Signed)
Post Partum Day 1 Subjective: She is feeling well and does not endorse any pain. She is ambulating and tolerating oral intake without difficulty. She would like to go home but states her baby is being monitored for bilirubin levels.  Objective: Blood pressure 103/64, pulse 74, temperature 98.7 F (37.1 C), temperature source Oral, resp. rate 18, height 5\' 5"  (1.651 m), weight 84.5 kg, last menstrual period 08/23/2020, SpO2 100 %, unknown if currently breastfeeding.  Physical Exam:  General: alert, cooperative, and no distress Lochia: appropriate Uterine Fundus: firm Incision: none DVT Evaluation: No evidence of DVT seen on physical exam.  Recent Labs    06/02/21 0125 06/02/21 0642  HGB 10.1* 9.9*  HCT 32.5* 32.0*    Assessment/Plan: Laurie Hansen is a 25 year old G40P1021 on PPD#1 who is recovering well and meeting all goals. -RhoGAM today -Oral contraception moving forward -Stable for discharge   LOS: 1 day   G6P0 06/03/2021, 7:07 AM

## 2021-06-04 ENCOUNTER — Telehealth: Payer: Self-pay

## 2021-06-04 LAB — RH IG WORKUP (INCLUDES ABO/RH)
Fetal Screen: NEGATIVE
Gestational Age(Wks): 39.6
Unit division: 0

## 2021-06-04 NOTE — Telephone Encounter (Signed)
Transition Care Management Follow-up Telephone Call Date of discharge and from where: 06/03/2021-[facility]  How have you been since you were released from the hospital? Patient stated she is doing fine.  Any questions or concerns? No  Items Reviewed: Did the pt receive and understand the discharge instructions provided? Yes  Medications obtained and verified? Yes  Other? No  Any new allergies since your discharge? No  Dietary orders reviewed? No Do you have support at home? Yes   Home Care and Equipment/Supplies: Were home health services ordered? not applicable If so, what is the name of the agency? N/A  Has the agency set up a time to come to the patient's home? not applicable Were any new equipment or medical supplies ordered?  No What is the name of the medical supply agency? N/A Were you able to get the supplies/equipment? not applicable Do you have any questions related to the use of the equipment or supplies? No  Functional Questionnaire: (I = Independent and D = Dependent) ADLs: I  Bathing/Dressing- I  Meal Prep- I  Eating- I  Maintaining continence- I  Transferring/Ambulation- I  Managing Meds- I  Follow up appointments reviewed:  PCP Hospital f/u appt confirmed? No   Specialist Hospital f/u appt confirmed? Yes  Scheduled to see OBGYN on 07/08/2021 @ 1:15PM. Are transportation arrangements needed? No  If their condition worsens, is the pt aware to call PCP or go to the Emergency Dept.? Yes Was the patient provided with contact information for the PCP's office or ED? Yes Was to pt encouraged to call back with questions or concerns? Yes

## 2021-06-15 ENCOUNTER — Telehealth (HOSPITAL_COMMUNITY): Payer: Self-pay | Admitting: *Deleted

## 2021-06-15 NOTE — Telephone Encounter (Signed)
Hospital Discharge Follow-Up Call:  Patient reports that she is well and has no concerns about her healing process.  EPDS today was 0 and patient endorses this accurately reflects that she doing well emotionally.  Patient says that baby is well and she has no concerns about baby's health.  She reports that baby sleeps in a bassinet.  Reviewed ABCs of Safe Sleep.

## 2021-07-08 ENCOUNTER — Ambulatory Visit: Payer: Self-pay | Admitting: Obstetrics and Gynecology

## 2021-07-20 ENCOUNTER — Other Ambulatory Visit: Payer: Self-pay | Admitting: Family Medicine

## 2021-07-21 ENCOUNTER — Other Ambulatory Visit: Payer: Self-pay | Admitting: Family Medicine

## 2021-09-14 ENCOUNTER — Ambulatory Visit (INDEPENDENT_AMBULATORY_CARE_PROVIDER_SITE_OTHER): Payer: Medicaid Other

## 2021-09-14 ENCOUNTER — Other Ambulatory Visit: Payer: Self-pay

## 2021-09-14 ENCOUNTER — Other Ambulatory Visit (HOSPITAL_COMMUNITY)
Admission: RE | Admit: 2021-09-14 | Discharge: 2021-09-14 | Disposition: A | Discharge status: Home / Self Care | Payer: Medicaid Other | Source: Ambulatory Visit | Attending: Family Medicine | Admitting: Family Medicine

## 2021-09-14 ENCOUNTER — Ambulatory Visit: Payer: Medicaid Other

## 2021-09-14 VITALS — BP 121/66 | HR 73 | Wt 162.0 lb

## 2021-09-14 DIAGNOSIS — N898 Other specified noninflammatory disorders of vagina: Secondary | ICD-10-CM | POA: Diagnosis not present

## 2021-09-14 NOTE — Progress Notes (Signed)
Here today with complaint of vaginal odor. Describes a clear to yellow vaginal discharge. No itching or other irritation. Reports having BV after delivery with first child. Pt also desires testing for STDs. Self swab instructions given and specimen obtained. Will contact patient with any abnormal results.  Apolonio Schneiders RN 09/14/21

## 2021-09-15 ENCOUNTER — Other Ambulatory Visit: Payer: Self-pay | Admitting: Family Medicine

## 2021-09-15 DIAGNOSIS — N760 Acute vaginitis: Secondary | ICD-10-CM

## 2021-09-15 DIAGNOSIS — B3731 Acute candidiasis of vulva and vagina: Secondary | ICD-10-CM

## 2021-09-15 DIAGNOSIS — B9689 Other specified bacterial agents as the cause of diseases classified elsewhere: Secondary | ICD-10-CM

## 2021-09-15 DIAGNOSIS — A749 Chlamydial infection, unspecified: Secondary | ICD-10-CM | POA: Insufficient documentation

## 2021-09-15 LAB — CERVICOVAGINAL ANCILLARY ONLY
Bacterial Vaginitis (gardnerella): POSITIVE — AB
Candida Glabrata: NEGATIVE
Candida Vaginitis: POSITIVE — AB
Chlamydia: POSITIVE — AB
Comment: NEGATIVE
Comment: NEGATIVE
Comment: NEGATIVE
Comment: NEGATIVE
Comment: NEGATIVE
Comment: NORMAL
Neisseria Gonorrhea: NEGATIVE
Trichomonas: NEGATIVE

## 2021-09-15 MED ORDER — FLUCONAZOLE 150 MG PO TABS: 150.0000 mg | ORAL_TABLET | Freq: Once | ORAL | 0 refills | Status: AC

## 2021-09-15 MED ORDER — DOXYCYCLINE HYCLATE 100 MG PO CAPS: 100.0000 mg | ORAL_CAPSULE | Freq: Two times a day (BID) | ORAL | 0 refills | Status: AC

## 2021-09-15 MED ORDER — METRONIDAZOLE 500 MG PO TABS: 500.0000 mg | ORAL_TABLET | Freq: Two times a day (BID) | ORAL | 0 refills | Status: AC

## 2021-09-16 ENCOUNTER — Telehealth: Payer: Self-pay | Admitting: Lactation Services

## 2021-09-16 NOTE — Telephone Encounter (Signed)
STD Report faxed to [facility] Department. Fax confirmation received.  ?

## 2021-09-21 ENCOUNTER — Encounter: Payer: Self-pay | Admitting: Family

## 2021-09-23 NOTE — Telephone Encounter (Signed)
This should not be a problem

## 2021-10-08 IMAGING — US US MFM OB COMP +14 WKS
1 series · 13 of 28 positions shown · non-contrast
Comparison: none

[Series 1: us mfm ob comp +14 wks · 122 acquisitions, 13 frames shown]
[im 5/122]
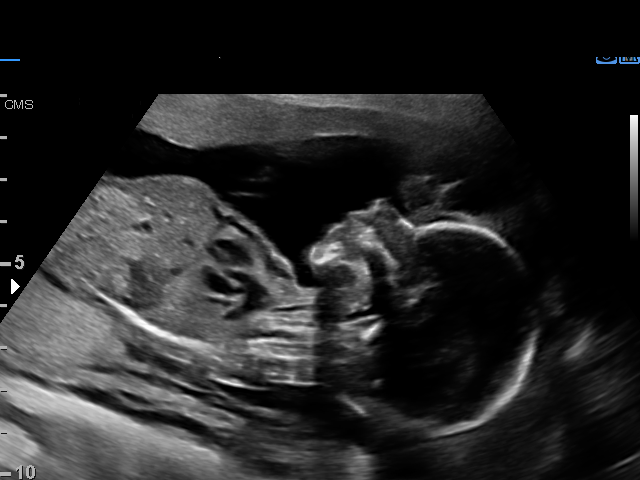
[im 14/122]
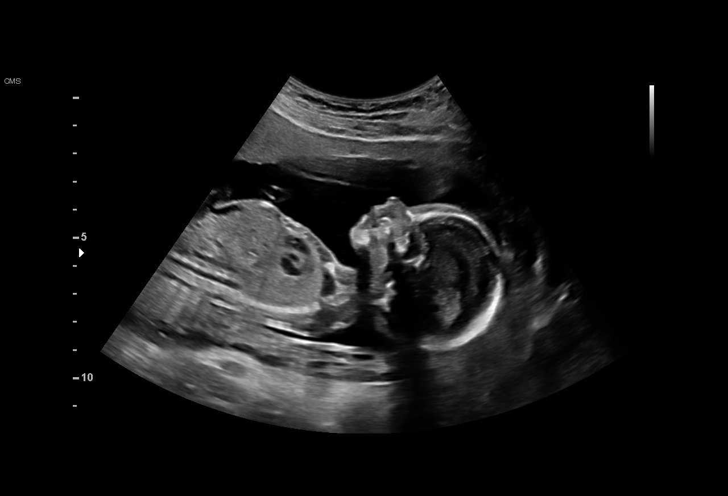
[im 23/122]
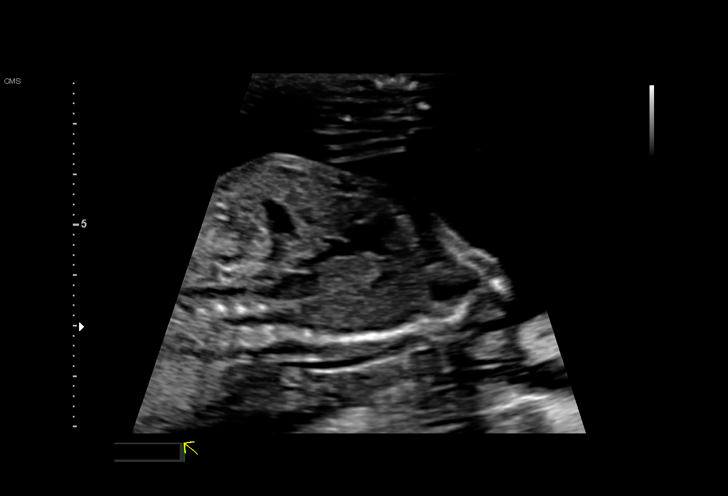
[im 32/122]
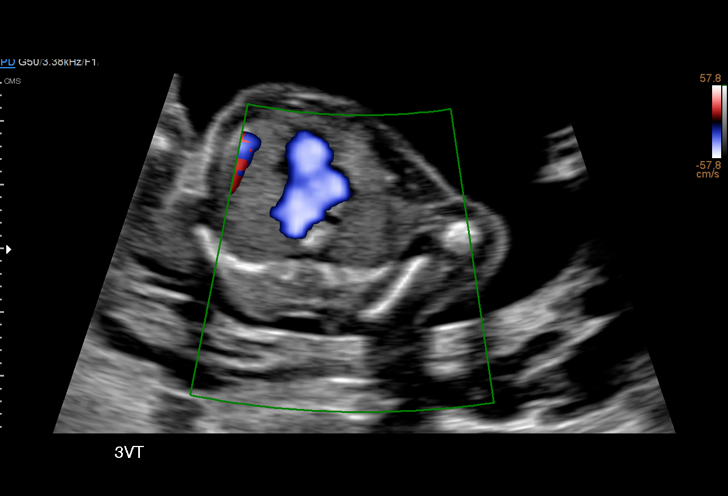
[im 41/122]
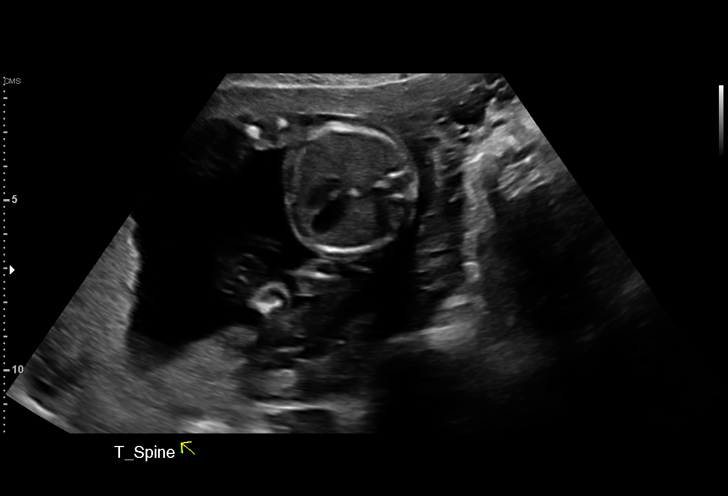
[im 50/122]
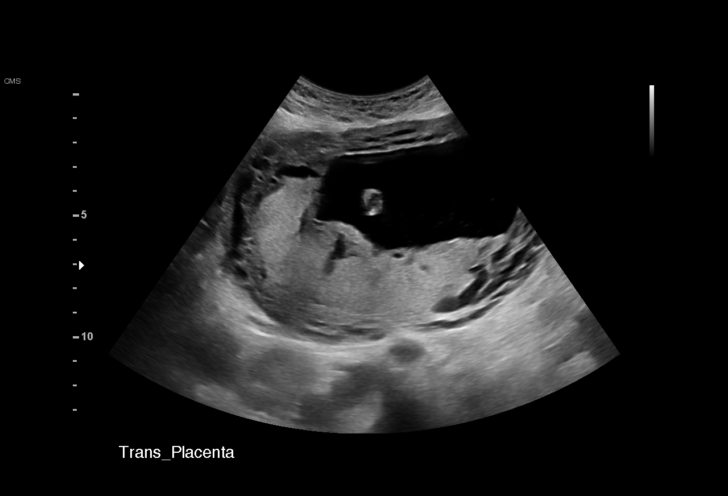
[im 63/122]
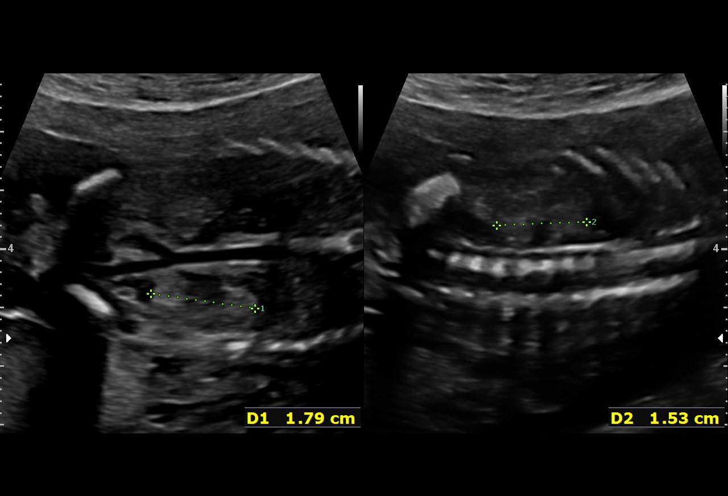
[im 72/122]
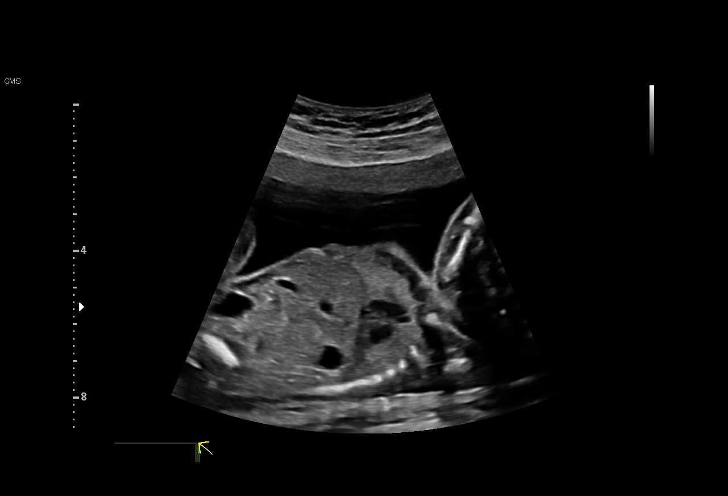
[im 81/122]
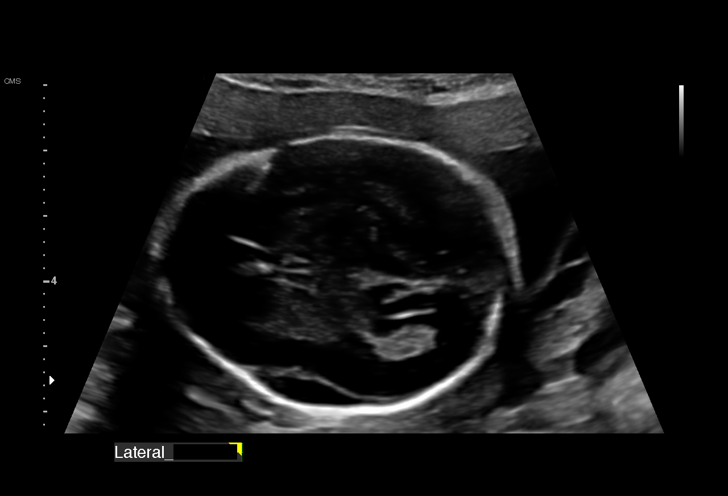
[im 90/122]
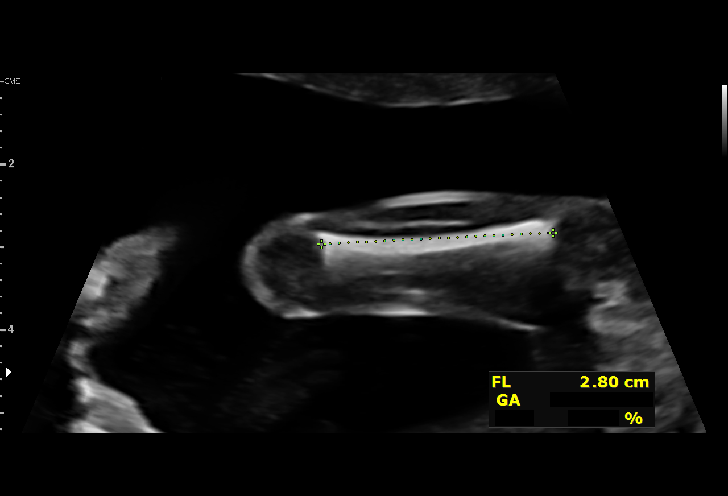
[im 99/122]
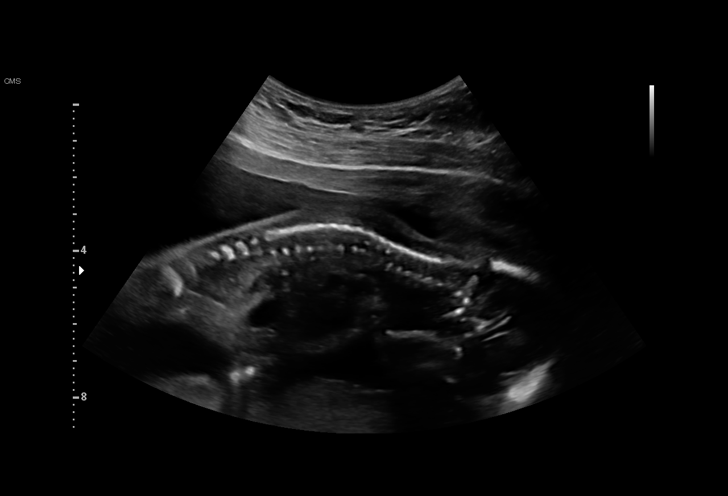
[im 108/122]
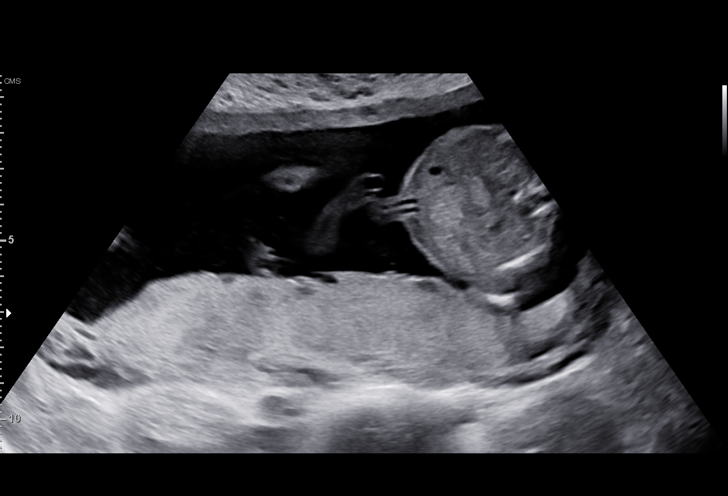
[im 117/122]
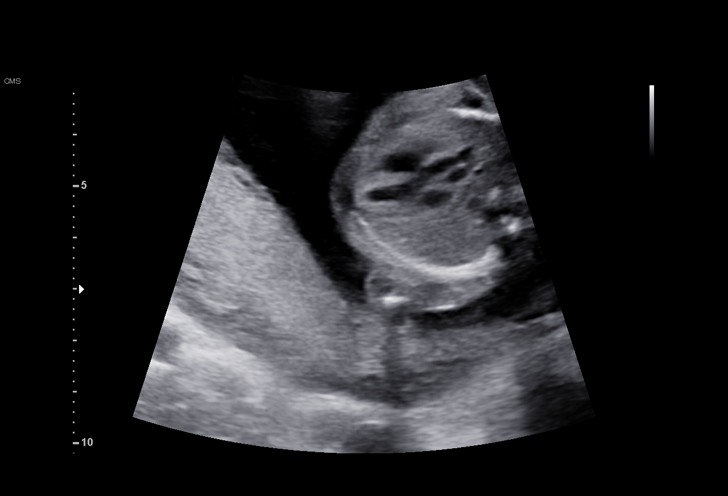

[13 of 28 positions shown; findings below may reference images not displayed]

1  US MFM OB COMP + 14 WK                76805.01    RYTCH ZATAR[facility]

Indications

 Antenatal screening for malformations
 18 weeks gestation of pregnancy
 Low risk NIPS, neg Horizon 4
Fetal Evaluation

 Num Of Fetuses:         1
 Fetal Heart Rate(bpm):  148
 Cardiac Activity:       Observed
 Presentation:           Cephalic
 Placenta:               Posterior
 P. Cord Insertion:      Visualized

 Amniotic Fluid
 AFI FV:      Within normal limits

                             Largest Pocket(cm)

Biometry

 BPD:      41.5  mm     G. Age:  18w 4d         51  %    CI:        71.64   %    70 - 86
                                                         FL/HC:      17.9   %    16.1 -
 HC:      156.1  mm     G. Age:  18w 4d         40  %    HC/AC:      1.22        1.09 -
 AC:      128.1  mm     G. Age:  18w 3d         40  %    FL/BPD:     67.2   %
 FL:       27.9  mm     G. Age:  18w 4d         42  %    FL/AC:      21.8   %    20 - 24
 HUM:      27.3  mm     G. Age:  18w 5d         57  %
 CER:      18.7  mm     G. Age:  18w 3d         35  %
 NFT:       3.2  mm
 LV:        5.6  mm
 CM:          5  mm

 Est. FW:     241  gm      0 lb 9 oz     38  %
OB History

 Gravidity:    4         Term:   1        Prem:   0        SAB:   0
 TOP:          2       Ectopic:  0        Living: 1
Gestational Age

 LMP:           19w 1d        Date:  08/23/20                 EDD:   05/30/21
 U/S Today:     18w 4d                                        EDD:   06/03/21
 Best:          18w 4d     Det. By:  U/S (01/04/21)           EDD:   06/03/21
Anatomy

 Cranium:               Appears normal         LVOT:                   Appears normal
 Cavum:                 Appears normal         Aortic Arch:            Appears normal
 Ventricles:            Appears normal         Ductal Arch:            Appears normal
 Choroid Plexus:        Appears normal         Diaphragm:              Appears normal
 Cerebellum:            Appears normal         Stomach:                Appears normal, left
                                                                       sided
 Posterior Fossa:       Appears normal         Abdomen:                Appears normal
 Nuchal Fold:           Appears normal         Abdominal Wall:         Appears nml (cord
                                                                       insert, abd wall)
 Face:                  Appears normal         Cord Vessels:           Appears normal (3
                        (orbits and profile)                           vessel cord)
 Lips:                  Appears normal         Kidneys:                Appear normal
 Palate:                Appears normal         Bladder:                Appears normal
 Thoracic:              Appears normal         Spine:                  Appears normal
 Heart:                 Appears normal         Upper Extremities:      Appears normal
                        (4CH, axis, and
                        situs)
 RVOT:                  Appears normal         Lower Extremities:      Appears normal

 Other:  Heels/feet and open hands/5th digits visualized. Lenses and nasal
         bone visualized. VC, 3VV and 3VTV visualized.
Cervix Uterus Adnexa

 Cervix
 Length:           3.45  cm.
 Normal appearance by transabdominal scan.

 Uterus
 No abnormality visualized.

 Right Ovary
 Not visualized.

 Left Ovary
 Within normal limits.

 Cul De Sac
 No free fluid seen.
 Adnexa
 No abnormality visualized.
Comments

 This patient was seen for a detailed fetal anatomy scan.
 She denies any significant past medical history and denies
 any problems in her current pregnancy.
 She had a cell free DNA test earlier in her pregnancy which
 indicated a low risk for trisomy 21, 18, and 13. A female fetus
 is predicted.
 Based on the fetal biometry measurements obtained today,
 her EDC was changed to June 03, 2021 making her 18
 weeks and 4 days pregnant today.  Had her EDC not been
 changed, the fetus would have measured small for her
 gestational age.  The patient reports that her prior EDC was
 based on an uncertain last menstrual period due to her
 irregular cycles.
 There were no obvious fetal anomalies noted on today's
 ultrasound exam.
 The patient was informed that anomalies may be missed due
 to technical limitations. If the fetus is in a suboptimal position
 or maternal habitus is increased, visualization of the fetus in
 the maternal uterus may be impaired.
 A follow-up exam was scheduled in 4 weeks to confirm her
 dates.

## 2021-10-27 NOTE — Progress Notes (Signed)
?Subjective:  ? ? Laurie Hansen - 26 y.o. female MRN [id number]  Date of birth: 1995/10/11 ? ?HPI ? ?Laurie Hansen is to establish care.   ? ? ?Current issues and/or concerns: ?No issues or concerns. Plans to return at later date for medical clearance pending liposuction scheduled for May 2023.  ? ?ROS per HPI  ? ? ?Health Maintenance:  ?There are no preventive care reminders to display for this patient. ? ?Past Medical History: ?Patient Active Problem List  ? Diagnosis Date Noted  ? Chlamydia infection 09/15/2021  ? Uterine contractions during pregnancy 06/02/2021  ? Abnormal fetal echocardiogram affecting antepartum care of mother 04/20/2021  ? Rh negative state in antepartum period 02/12/2021  ? Supervision of low-risk pregnancy 11/23/2020  ? Migraine with aura, without mention of intractable migraine without mention of status migrainosus 10/21/2013  ? ? ? ? ?Social History  ? reports that she has never smoked. She has never been exposed to tobacco smoke. She has never used smokeless tobacco. She reports that she does not drink alcohol and does not use drugs.  ? ?Family History  ?family history includes Aneurysm in an other family member; Asthma in her maternal grandmother; COPD in her maternal grandmother; Cancer in her mother; Diabetes in her paternal grandmother; Hypertension in her maternal grandmother; Lupus in her mother; Migraines in her brother, mother, paternal grandmother, and other family members.  ? ?Medications: reviewed and updated ?  ?Objective:  ? Physical Exam ?BP 119/77 (BP Location: Left Arm, Patient Position: Sitting, Cuff Size: Large)   Pulse 70   Temp 98.3 ?F (36.8 ?C)   Resp 18   Ht 5' 5.75" (1.67 m)   Wt 154 lb (69.9 kg)   SpO2 99%   Breastfeeding No   BMI 25.05 kg/m?  ? ?Physical Exam ?HENT:  ?   Head: Normocephalic and atraumatic.  ?Eyes:  ?   Extraocular Movements: Extraocular movements intact.  ?   Conjunctiva/sclera: Conjunctivae normal.  ?   Pupils: Pupils are equal,  round, and reactive to light.  ?Cardiovascular:  ?   Rate and Rhythm: Normal rate and regular rhythm.  ?   Pulses: Normal pulses.  ?   Heart sounds: Normal heart sounds.  ?Pulmonary:  ?   Effort: Pulmonary effort is normal.  ?   Breath sounds: Normal breath sounds.  ?Musculoskeletal:  ?   Cervical back: Normal range of motion and neck supple.  ?Neurological:  ?   General: No focal deficit present.  ?   Mental Status: She is alert and oriented to person, place, and time.  ?Psychiatric:     ?   Mood and Affect: Mood normal.     ?   Behavior: Behavior normal.  ? ? ?   ?Assessment & Plan:  ?1. Encounter to establish care: ?- Patient presents today to establish care.  ?- Return for annual physical examination, labs, and health maintenance. Arrive fasting meaning having no food for at least 8 hours prior to appointment. You may have only water or black coffee. Please take scheduled medications as normal. ? ? ? ?Patient was given clear instructions to go to Emergency Department or return to medical center if symptoms don't improve, worsen, or new problems develop.The patient verbalized understanding. ? ?I discussed the assessment and treatment plan with the patient. The patient was provided an opportunity to ask questions and all were answered. The patient agreed with the plan and demonstrated an understanding of the instructions. ?  ?The patient  was advised to call back or seek an in-person evaluation if the symptoms worsen or if the condition fails to improve as anticipated. ? ? ? ?Ricky Stabs, NP ?11/02/2021, 9:29 PM ?Primary Care at [facility]  ? ?

## 2021-11-02 ENCOUNTER — Encounter: Payer: Self-pay | Admitting: Family

## 2021-11-02 ENCOUNTER — Ambulatory Visit (INDEPENDENT_AMBULATORY_CARE_PROVIDER_SITE_OTHER): Payer: Medicaid Other | Admitting: Family

## 2021-11-02 VITALS — BP 119/77 | HR 70 | Temp 98.3°F | Resp 18 | Ht 65.75 in | Wt 154.0 lb

## 2021-11-02 DIAGNOSIS — Z7689 Persons encountering health services in other specified circumstances: Secondary | ICD-10-CM | POA: Diagnosis not present

## 2021-11-02 NOTE — Progress Notes (Signed)
Pt presents to establish care, last physical and labs w/ob/gyn on 11/24/2020  ?

## 2021-11-02 NOTE — Patient Instructions (Signed)
Thank you for choosing Primary Care at [facility] for your medical home!   ? ?Laurie Hansen was seen by Camillia Herter, NP today.  ? ?Laurie Hansen primary care provider is Durene Fruits, NP.   ?For the best care possible,  you should try to see Durene Fruits, NP ?whenever you come to office.  ? ?We look forward to seeing you again soon! ? ?If you have any questions about your visit today,  ?please call us at [phone] ? ?Or feel free to reach your provider via [place].   ? ?Preventive Care 73-31 Years Old, Female ?Preventive care refers to lifestyle choices and visits with your health care provider that can promote health and wellness. Preventive care visits are also called wellness exams. ?What can I expect for my preventive care visit? ?Counseling ?During your preventive care visit, your health care provider may ask about your: ?Medical history, including: ?Past medical problems. ?Family medical history. ?Pregnancy history. ?Current health, including: ?Menstrual cycle. ?Method of birth control. ?Emotional well-being. ?Home life and relationship well-being. ?Sexual activity and sexual health. ?Lifestyle, including: ?Alcohol, nicotine or tobacco, and drug use. ?Access to firearms. ?Diet, exercise, and sleep habits. ?Work and work Statistician. ?Sunscreen use. ?Safety issues such as seatbelt and bike helmet use. ?Physical exam ?Your health care provider may check your: ?Height and weight. These may be used to calculate your BMI (body mass index). BMI is a measurement that tells if you are at a healthy weight. ?Waist circumference. This measures the distance around your waistline. This measurement also tells if you are at a healthy weight and may help predict your risk of certain diseases, such as type 2 diabetes and high blood pressure. ?Heart rate and blood pressure. ?Body temperature. ?Skin for abnormal spots. ?What immunizations do I need? ? ?Vaccines are usually given at various ages, according to  a schedule. Your health care provider will recommend vaccines for you based on your age, medical history, and lifestyle or other factors, such as travel or where you work. ?What tests do I need? ?Screening ?Your health care provider may recommend screening tests for certain conditions. This may include: ?Pelvic exam and Pap test. ?Lipid and cholesterol levels. ?Diabetes screening. This is done by checking your blood sugar (glucose) after you have not eaten for a while (fasting). ?Hepatitis B test. ?Hepatitis C test. ?HIV (human immunodeficiency virus) test. ?STI (sexually transmitted infection) testing, if you are at risk. ?BRCA-related cancer screening. This may be done if you have a family history of breast, ovarian, tubal, or peritoneal cancers. ?Talk with your health care provider about your test results, treatment options, and if necessary, the need for more tests. ?Follow these instructions at home: ?Eating and drinking ? ?Eat a healthy diet that includes fresh fruits and vegetables, whole grains, lean protein, and low-fat dairy products. ?Take vitamin and mineral supplements as recommended by your health care provider. ?Do not drink alcohol if: ?Your health care provider tells you not to drink. ?You are pregnant, may be pregnant, or are planning to become pregnant. ?If you drink alcohol: ?Limit how much you have to 0-1 drink a day. ?Know how much alcohol is in your drink. In the [place] one drink equals one 12 oz bottle of beer (355 mL), one 5 oz glass of wine (148 mL), or one 1? oz glass of hard liquor (44 mL). ?Lifestyle ?Brush your teeth every morning and night with fluoride toothpaste. Floss one time each day. ?Exercise for at least 30 minutes  5 or more days each week. ?Do not use any products that contain nicotine or tobacco. These products include cigarettes, chewing tobacco, and vaping devices, such as e-cigarettes. If you need help quitting, ask your health care provider. ?Do not use drugs. ?If you are  sexually active, practice safe sex. Use a condom or other form of protection to prevent STIs. ?If you do not wish to become pregnant, use a form of birth control. If you plan to become pregnant, see your health care provider for a prepregnancy visit. ?Find healthy ways to manage stress, such as: ?Meditation, yoga, or listening to music. ?Journaling. ?Talking to a trusted person. ?Spending time with friends and family. ?Minimize exposure to UV radiation to reduce your risk of skin cancer. ?Safety ?Always wear your seat belt while driving or riding in a vehicle. ?Do not drive: ?If you have been drinking alcohol. Do not ride with someone who has been drinking. ?If you have been using any mind-altering substances or drugs. ?While texting. ?When you are tired or distracted. ?Wear a helmet and other protective equipment during sports activities. ?If you have firearms in your house, make sure you follow all gun safety procedures. ?Seek help if you have been physically or sexually abused. ?What's next? ?Go to your health care provider once a year for an annual wellness visit. ?Ask your health care provider how often you should have your eyes and teeth checked. ?Stay up to date on all vaccines. ?This information is not intended to replace advice given to you by your health care provider. Make sure you discuss any questions you have with your health care provider. ?Document Revised: 12/30/2020 Document Reviewed: 12/30/2020 ?Elsevier Patient Education ? [place]. ? ?

## 2021-11-06 IMAGING — US US MFM OB FOLLOW-UP
1 series · 13 of 28 positions shown · non-contrast
Comparison: none

[Series 1: us mfm ob follow-up · 76 acquisitions, 13 frames shown]
[im 3/76]
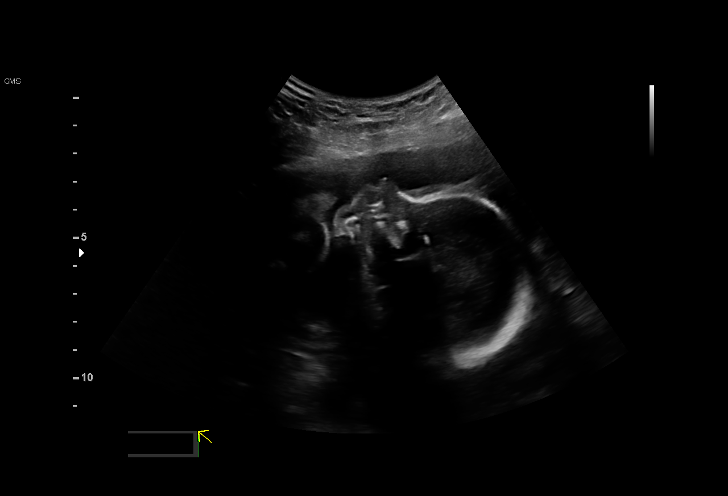
[im 9/76]
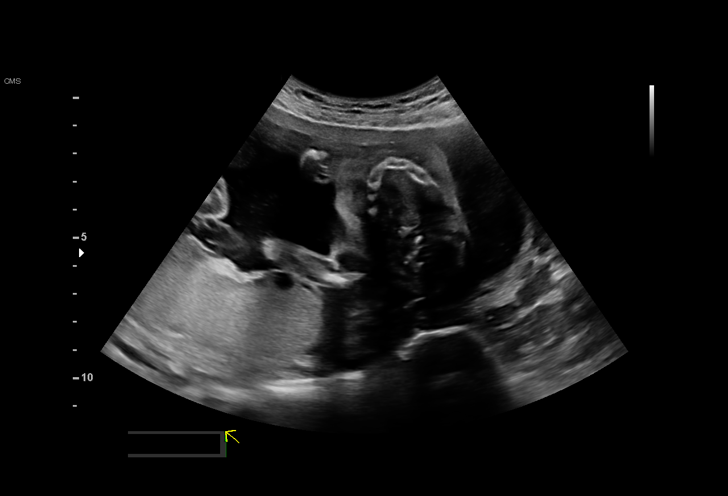
[im 14/76]
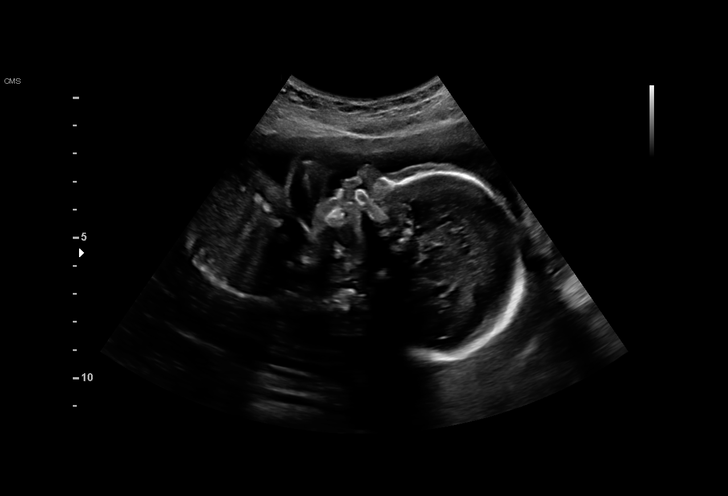
[im 20/76]
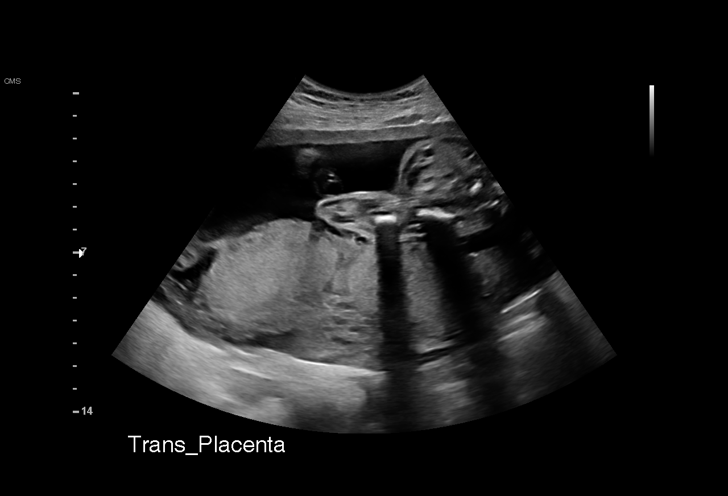
[im 26/76]
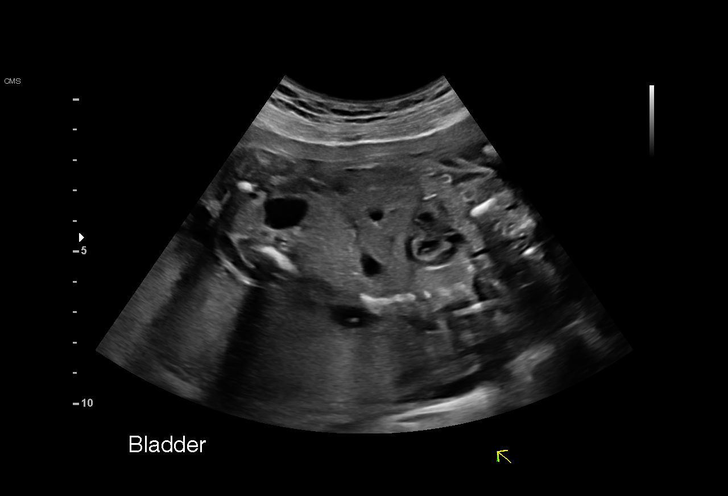
[im 31/76]
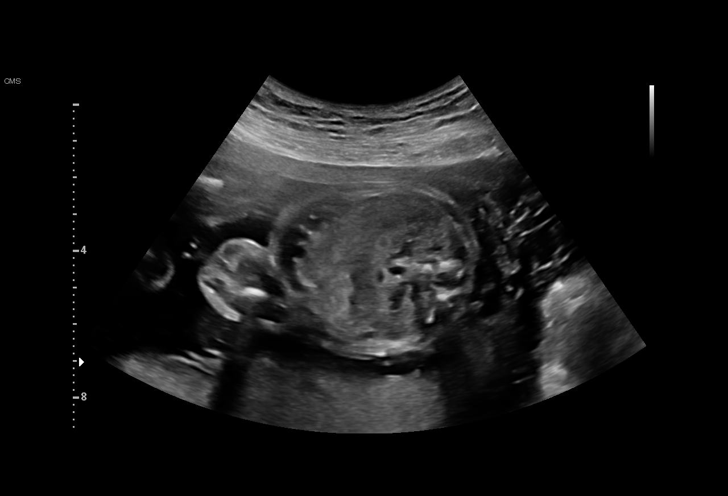
[im 39/76]
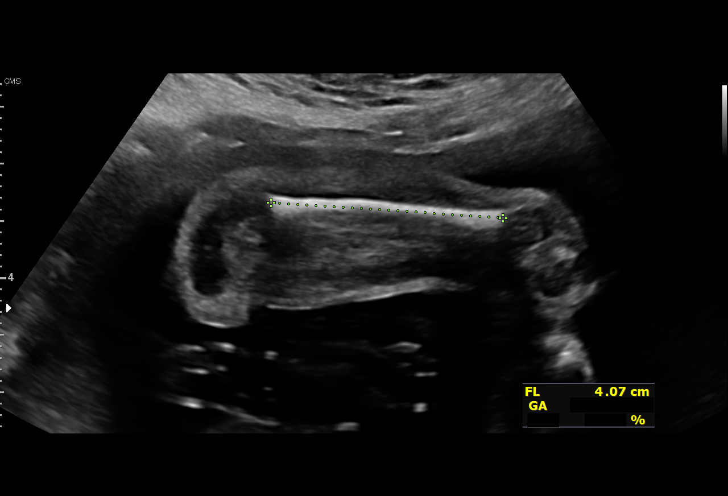
[im 45/76]
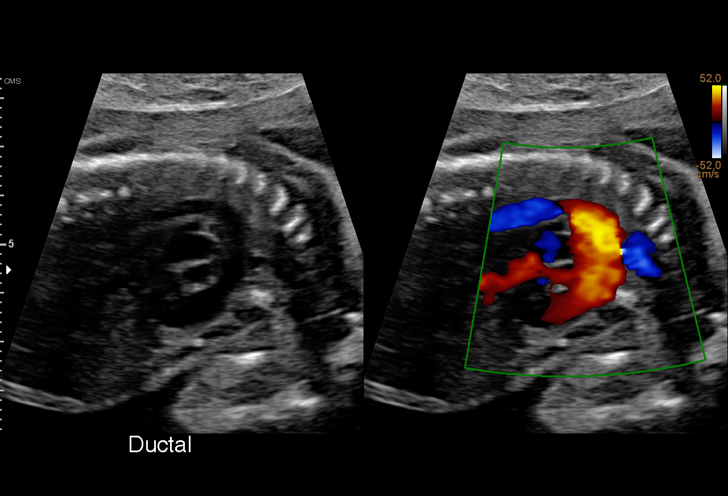
[im 51/76]
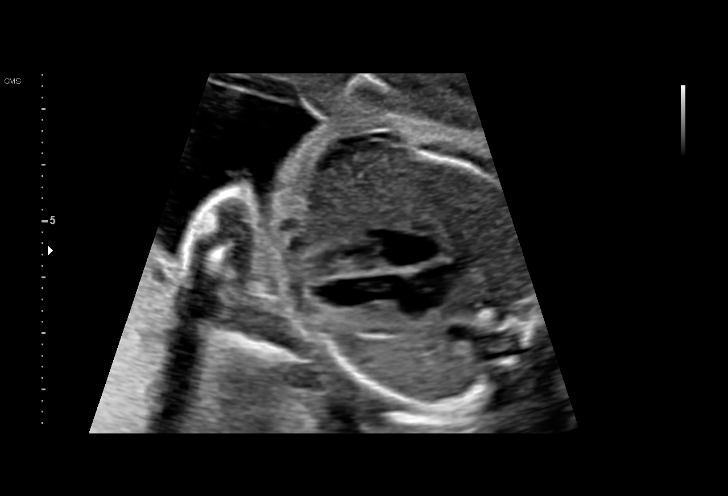
[im 56/76]
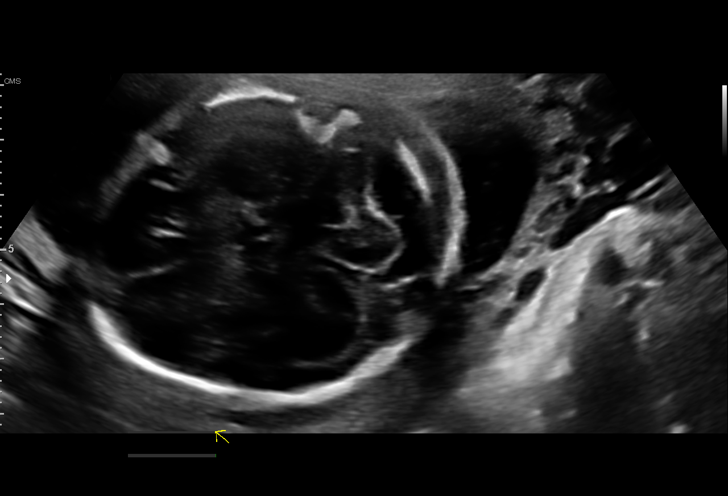
[im 62/76]
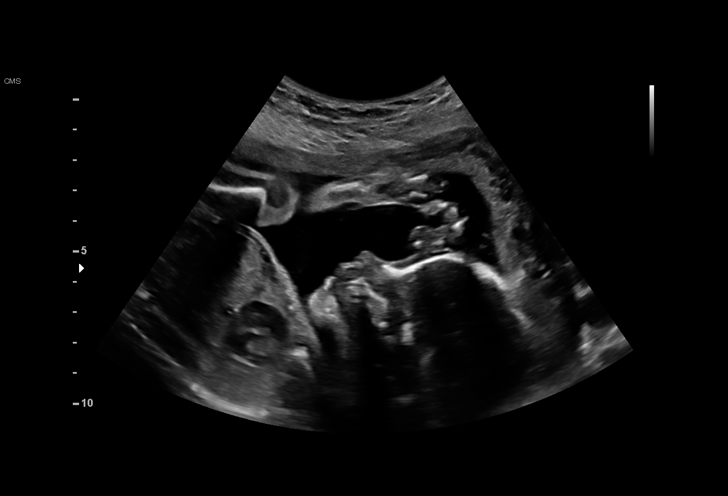
[im 67/76]
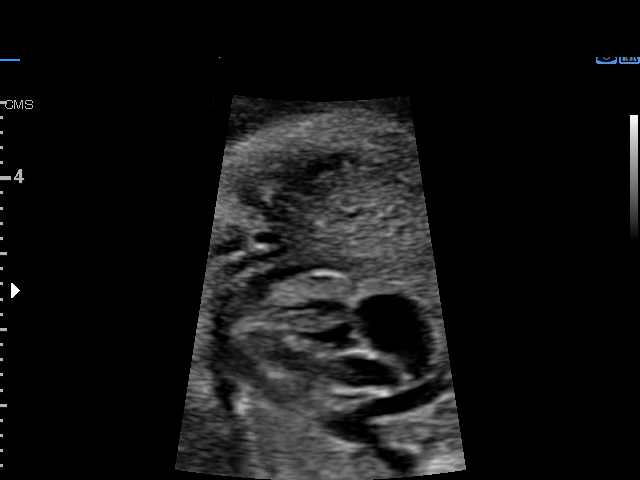
[im 73/76]
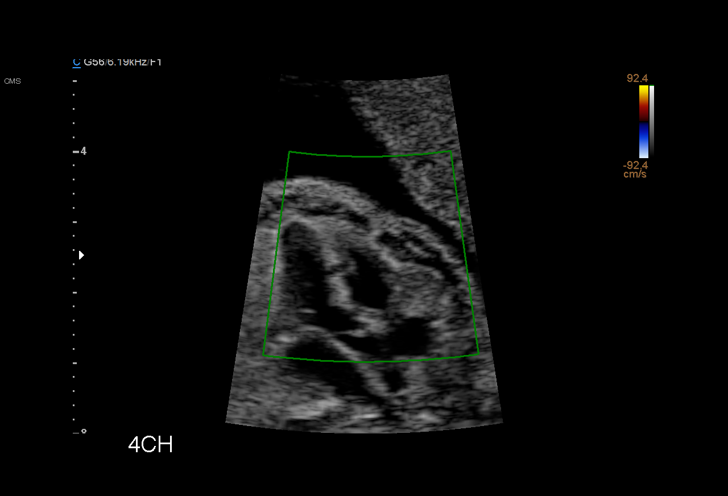

[13 of 28 positions shown; findings below may reference images not displayed]

Indications

 Fetal cardiac anomaly affecting pregnancy,
 antepartum (suspected)
 22 weeks gestation of pregnancy
 Encounter for other antenatal screening
 follow-up
 Low risk NIPS, neg Horizon 4
 Encounter for uncertain dates
Fetal Evaluation

 Num Of Fetuses:         1
 Fetal Heart Rate(bpm):  158
 Cardiac Activity:       Observed
 Presentation:           Cephalic
 Placenta:               Posterior
 P. Cord Insertion:      Visualized

 Amniotic Fluid
 AFI FV:      Within normal limits

                             Largest Pocket(cm)

Biometry

 BPD:        57  mm     G. Age:  23w 3d         73  %    CI:        76.61   %    70 - 86
                                                         FL/HC:      19.8   %    19.2 -
 HC:      206.3  mm     G. Age:  22w 5d         37  %    HC/AC:      1.16        1.05 -
 AC:       178   mm     G. Age:  22w 5d         41  %    FL/BPD:     71.8   %    71 - 87
 FL:       40.9  mm     G. Age:  23w 2d         57  %    FL/AC:      23.0   %    20 - 24
 HUM:      37.6  mm     G. Age:  23w 1d         55  %

 LV:        5.9  mm
 Est. FW:     548  gm      1 lb 3 oz     54  %
OB History

 Gravidity:    4         Term:   1        Prem:   0        SAB:   0
 TOP:          2       Ectopic:  0        Living: 1
Gestational Age

 LMP:           23w 2d        Date:  08/23/20                 EDD:   05/30/21
 U/S Today:     23w 0d                                        EDD:   06/01/21
 Best:          22w 5d     Det. By:  U/S  (01/04/21)          EDD:   06/03/21
Anatomy

 Cranium:               Appears normal         LVOT:                   Appears normal
 Cavum:                 Appears normal         Aortic Arch:            Appears normal
 Ventricles:            Appears normal         Ductal Arch:            Appears normal
 Choroid Plexus:        Appears normal         Diaphragm:              Appears normal
 Cerebellum:            Appears normal         Stomach:                Appears normal, left
                                                                       sided
 Posterior Fossa:       Appears normal         Abdomen:                Appears normal
 Nuchal Fold:           Previously seen        Abdominal Wall:         Appears nml (cord
                                                                       insert, abd wall)
 Face:                  Appears normal         Cord Vessels:           Appears normal (3
                        (orbits and profile)                           vessel cord)
 Lips:                  Previously seen        Kidneys:                Appear normal
 Palate:                Appears normal         Bladder:                Appears normal
 Thoracic:              Appears normal         Spine:                  Previously seen
 Heart:                 Small muscular         Upper Extremities:      Previously seen
                        VSD
 RVOT:                  Appears normal         Lower Extremities:      Previously seen

 Other:  Heels/feet and open hands/5th digits previously visualized. Lenses
         and nasal bone visualized. VC, 3VV and 3VTV visualized.
Cervix Uterus Adnexa

 Cervix
 Length:           3.78  cm.
 Normal appearance by transabdominal scan.

 Uterus
 No abnormality visualized.

 Right Ovary
 Not visualized.

 Left Ovary
 Within normal limits.

 Cul De Sac
 No free fluid seen.
 Adnexa
 No abnormality visualized.
Impression

 Patient returned for interval fetal growth assessment and
 confirmation of pregnancy dating that was established at
 previous ultrasound.  Fetal anatomical survey was completed
 at previous scan and appeared normal.

 Amniotic fluid is normal and good fetal activity seen.  Fetal
 growth is appropriate for gestational age.  Good interval fetal
 growth is seen.  There is a small suspicion of a muscular
 VSD.  No perimembranous VSD seen.  Cardiac anatomy,
 otherwise, appears normal.

 I explained the findings (low suspicion) and recommended
 fetal echocardiography.  Patient agreed to have fetal
 echocardiography.
Recommendations

 -We have requested an appointment for fetal
 echocardiography ([HOSPITAL], [facility]).
 -An appointment was made for her to return in 6 weeks for
 fetal growth assessment.
                 [place], [place]

## 2021-11-10 ENCOUNTER — Encounter: Payer: Self-pay | Admitting: Family
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
## 2021-11-12 ENCOUNTER — Ambulatory Visit: Payer: Medicaid Other | Admitting: Nurse Practitioner

## 2021-11-15 ENCOUNTER — Ambulatory Visit: Payer: Medicaid Other | Admitting: Family

## 2021-11-16 ENCOUNTER — Other Ambulatory Visit: Payer: Self-pay | Admitting: Family

## 2021-11-16 ENCOUNTER — Encounter: Payer: Self-pay | Admitting: Family

## 2021-11-16 ENCOUNTER — Ambulatory Visit (INDEPENDENT_AMBULATORY_CARE_PROVIDER_SITE_OTHER): Payer: Medicaid Other | Admitting: Family

## 2021-11-16 VITALS — BP 124/81 | HR 69 | Temp 98.5°F | Resp 18 | Ht 65.75 in | Wt 154.0 lb

## 2021-11-16 DIAGNOSIS — Z01818 Encounter for other preprocedural examination: Secondary | ICD-10-CM | POA: Diagnosis not present

## 2021-11-16 DIAGNOSIS — R001 Bradycardia, unspecified: Secondary | ICD-10-CM

## 2021-11-16 DIAGNOSIS — R9431 Abnormal electrocardiogram [ECG] [EKG]: Secondary | ICD-10-CM

## 2021-11-16 NOTE — Progress Notes (Signed)
Patient returned to office with preoperative paperwork. Scheduled for liposuction 360 with fat transfer to buttocks (BBL) on 12/08/2021.  ?Surgeon: Donnita Falls, MD ?Address:  ?[facility] ?[address] [place], [address] ?Phone: [phone] ? ?

## 2021-11-16 NOTE — Progress Notes (Signed)
Referral to Cardiology for further evaluation and management of sinus bradycardia. Expect a call within 2 weeks with appointment details.

## 2021-11-16 NOTE — Progress Notes (Signed)
Pt presents for pre-op clearance  

## 2021-11-17 ENCOUNTER — Other Ambulatory Visit: Payer: Self-pay | Admitting: Family

## 2021-11-17 ENCOUNTER — Encounter: Payer: Self-pay | Admitting: Family

## 2021-11-17 DIAGNOSIS — Z01818 Encounter for other preprocedural examination: Secondary | ICD-10-CM

## 2021-11-17 NOTE — Telephone Encounter (Signed)
Spoke w/pt states she has found a Cardiologist that can see her by Friday, adv pt that she would need to make sure the provider states that she is cleared to have surgery so Amy Zonia Kief will be able to complete medical clearance form  ?

## 2021-11-17 NOTE — Progress Notes (Signed)
?Cardiology Office Note:   ? ?Date:  11/18/2021  ? ?ID:  Laurie Hansen, DOB Jan 21, 1996, MRN [id number] ? ?PCP:  Laurie Fendt, NP  ? ?[facility] ?Cardiologist:  Laurie Skeans, MD ?Referring MD: Laurie Fendt, NP  ? ?Chief Complaint/Reason for Referral: Sinus bradycardia ? ?ASSESSMENT:   ? ?1. Sinus bradycardia   ? ? ?PLAN:   ? ?In order of problems listed above: ?1.  Sinus bradycardia: This is of no clinical significance given the patient is asymptomatic.  The patient's heart rate is 68 today.  No further cardiac evaluation is required.  I will keep follow-up with me open-ended.  She is at low periprocedural risk during her liposuction procedure. ? ? ?     ? ?   ? ?Dispo:  Return if symptoms worsen or fail to improve.  ? ?  ? ?Medication Adjustments/Labs and Tests Ordered: ?Current medicines are reviewed at length with the patient today.  Concerns regarding medicines are outlined above. ? ?The following changes have been made:  no change  ? ?Labs/tests ordered: ?No orders of the defined types were placed in this encounter. ? ? ?Medication Changes: ?No orders of the defined types were placed in this encounter. ? ? ? ?Current medicines are reviewed at length with the patient today.  The patient does not have concerns regarding medicines. ? ? ?History of Present Illness:   ? ?FOCUSED PROBLEM LIST:   ?1.  Iron deficiency anemia ?2.  ADHD ?3.  History of depression with suicidal ideation ? ?The patient is a 26 y.o. female with the indicated medical history here for recommendations regarding sinus bradycardia.  The patient was seen at her primary care provider's office recently.  She is scheduled to have liposuction done elsewhere.  An EKG was done which demonstrated sinus bradycardia with a heart rate of 58 bpm. ? ?The patient works full-time without limitations.  She denies any presyncope, syncope, palpitations, paroxysmal nocturnal dyspnea, orthopnea, exertional chest pain, or exertional dyspnea.    She does not smoke or drink.  She has 2 kids that she cares for as well.  She is otherwise well without complaints. ? ? ?Current Medications: ?No outpatient medications have been marked as taking for the 11/18/21 encounter (Office Visit) with Laurie Pyo, MD.  ?  ? ?Allergies:    ?Patient has no known allergies.  ? ?Social History:   ?Social History  ? ?Tobacco Use  ? Smoking status: Never  ?  Passive exposure: Never  ? Smokeless tobacco: Never  ?Vaping Use  ? Vaping Use: Never used  ?Substance Use Topics  ? Alcohol use: No  ? Drug use: No  ?  ? ?Family Hx: ?Family History  ?Problem Relation Age of Onset  ? Migraines Mother   ? Cancer Mother   ? Lupus Mother   ? Hypertension Maternal Grandmother   ? COPD Maternal Grandmother   ? Asthma Maternal Grandmother   ? Migraines Paternal Grandmother   ? Diabetes Paternal Grandmother   ? Migraines Brother   ? Migraines Other   ?     Maternal great-grandmother  ? Migraines Other   ?     Two maternal great aunts  ? Aneurysm Other   ?     Maternal great uncle  ?  ? ?Review of Systems:   ?Please see the history of present illness.    ?All other systems reviewed and are negative. ?  ? ? ?EKGs/Labs/Other Test Reviewed:   ? ?EKG:  EKG performed Nov 16, 2021 that I personally reviewed demonstrates sinus bradycardia ? ?Prior CV studies: ?None available ? ?Other studies Reviewed: ?Review of the additional studies/records demonstrates: None relevant ? ?Recent Labs: ?01/09/2021: ALT 11; BUN <5; Creatinine, Ser 0.51; Potassium 3.6; Sodium 135 ?06/02/2021: Hemoglobin 9.9; Platelets 186  ? ?Recent Lipid Panel ?No results found for: CHOL, TRIG, HDL, LDLCALC, LDLDIRECT ? ?Risk Assessment/Calculations:   ? ?  ?    ? ?Physical Exam:   ? ?VS:  BP 100/60   Pulse 68   Ht 5' 5.75" (1.67 m)   Wt 157 lb 6.4 oz (71.4 kg)   SpO2 98%   BMI 25.60 kg/m?    ?Wt Readings from Last 3 Encounters:  ?11/18/21 157 lb 6.4 oz (71.4 kg)  ?11/16/21 154 lb (69.9 kg)  ?11/02/21 154 lb (69.9 kg)  ?  ?GENERAL:  No  apparent distress, AOx3 ?HEENT:  No carotid bruits, +2 carotid impulses, no scleral icterus ?CAR: RRR no murmurs, gallops, rubs, or thrills ?RES:  Clear to auscultation bilaterally ?ABD:  Soft, nontender, nondistended, positive bowel sounds x 4 ?VASC:  +2 radial pulses, +2 carotid pulses, palpable pedal pulses ?NEURO:  CN 2-12 grossly intact; motor and sensory grossly intact ?PSYCH:  No active depression or anxiety ?EXT:  No edema, ecchymosis, or cyanosis ? ?Signed, ?Laurie Pyo, MD  ?11/18/2021 3:19 PM    ?[facility] ?[address] [place], [place], [place]  [address] ?Phone: [phone]; Fax: [phone]  ? ?Note:  This document was prepared using Dragon voice recognition software and may include unintentional dictation errors. ?

## 2021-11-18 ENCOUNTER — Ambulatory Visit (INDEPENDENT_AMBULATORY_CARE_PROVIDER_SITE_OTHER): Payer: Medicaid Other | Admitting: Internal Medicine

## 2021-11-18 ENCOUNTER — Encounter: Payer: Self-pay | Admitting: Internal Medicine

## 2021-11-18 VITALS — BP 100/60 | HR 68 | Ht 65.75 in | Wt 157.4 lb

## 2021-11-18 DIAGNOSIS — R001 Bradycardia, unspecified: Secondary | ICD-10-CM | POA: Diagnosis not present

## 2021-11-18 NOTE — Patient Instructions (Signed)
Medication Instructions:  No changes *If you need a refill on your cardiac medications before your next appointment, please call your pharmacy*   Lab Work: none If you have labs (blood work) drawn today and your tests are completely normal, you will receive your results only by: MyChart Message (if you have MyChart) OR A paper copy in the mail If you have any lab test that is abnormal or we need to change your treatment, we will call you to review the results.   Testing/Procedures: none   Follow-Up: As needed  Important Information About Sugar       

## 2021-11-20 LAB — URINALYSIS, ROUTINE W REFLEX MICROSCOPIC
Bilirubin, UA: NEGATIVE
Glucose, UA: NEGATIVE
Leukocytes,UA: NEGATIVE
Nitrite, UA: NEGATIVE
RBC, UA: NEGATIVE
Specific Gravity, UA: >=1.03 — AB (ref 1.005–1.030)
Urobilinogen, Ur: 1 mg/dL (ref 0.2–1.0)
pH, UA: 6.5 (ref 5.0–7.5)

## 2021-11-20 NOTE — Progress Notes (Signed)
No urinary tract infection.  ? ?All other values are normal, stable or within acceptable limits. ? ?Rema Fendt, NP 11/20/2021 7:49 AM  ?

## 2021-11-21 LAB — PT AND PTT
INR: 1.1 (ref 0.9–1.2)
Prothrombin Time: 11.2 s (ref 9.1–12.0)
aPTT: 29 s (ref 24–33)

## 2021-11-21 LAB — HEMOGLOBIN A1C
Est. average glucose Bld gHb Est-mCnc: 103 mg/dL
Hgb A1c MFr Bld: 5.2 % (ref 4.8–5.6)

## 2021-11-21 LAB — CMP14+EGFR
ALT: 20 IU/L (ref 0–32)
AST: 19 IU/L (ref 0–40)
Albumin/Globulin Ratio: 1.8 (ref 1.2–2.2)
Albumin: 4.9 g/dL (ref 3.9–5.0)
Alkaline Phosphatase: 96 IU/L (ref 44–121)
BUN/Creatinine Ratio: 14 (ref 9–23)
BUN: 10 mg/dL (ref 6–20)
Bilirubin Total: 0.5 mg/dL (ref 0.0–1.2)
CO2: 21 mmol/L (ref 20–29)
Calcium: 9.5 mg/dL (ref 8.7–10.2)
Chloride: 101 mmol/L (ref 96–106)
Creatinine, Ser: 0.71 mg/dL (ref 0.57–1.00)
Globulin, Total: 2.7 g/dL (ref 1.5–4.5)
Glucose: 76 mg/dL (ref 70–99)
Potassium: 3.8 mmol/L (ref 3.5–5.2)
Sodium: 139 mmol/L (ref 134–144)
Total Protein: 7.6 g/dL (ref 6.0–8.5)
eGFR: 122 mL/min/{1.73_m2} (ref >59)

## 2021-11-21 LAB — TSH+T4F+T3FREE
Free T4: 1.64 ng/dL (ref 0.82–1.77)
T3, Free: 5 pg/mL — ABNORMAL HIGH (ref 2.0–4.4)
TSH: 0.435 u[IU]/mL — ABNORMAL LOW (ref 0.450–4.500)

## 2021-11-21 LAB — URINALYSIS, ROUTINE W REFLEX MICROSCOPIC

## 2021-11-21 LAB — HAV, HBV, HCV
HCV Ab: NONREACTIVE
Hep B Core Total Ab: NEGATIVE
Hep B Surface Ab, Qual: NONREACTIVE
Hepatitis B Surface Ag: NEGATIVE
hep A Total Ab: POSITIVE — AB

## 2021-11-21 LAB — CBC WITH DIFFERENTIAL
Basophils Absolute: 0 10*3/uL (ref 0.0–0.2)
Basos: 1 %
EOS (ABSOLUTE): 0.1 10*3/uL (ref 0.0–0.4)
Eos: 1 %
Hematocrit: 42.3 % (ref 34.0–46.6)
Hemoglobin: 13.4 g/dL (ref 11.1–15.9)
Immature Grans (Abs): 0 10*3/uL (ref 0.0–0.1)
Immature Granulocytes: 0 %
Lymphocytes Absolute: 1.6 10*3/uL (ref 0.7–3.1)
Lymphs: 34 %
MCH: 26.2 pg — ABNORMAL LOW (ref 26.6–33.0)
MCHC: 31.7 g/dL (ref 31.5–35.7)
MCV: 83 fL (ref 79–97)
Monocytes Absolute: 0.3 10*3/uL (ref 0.1–0.9)
Monocytes: 6 %
Neutrophils Absolute: 2.9 10*3/uL (ref 1.4–7.0)
Neutrophils: 58 %
RBC: 5.11 x10E6/uL (ref 3.77–5.28)
RDW: 15 % (ref 11.7–15.4)
WBC: 4.9 10*3/uL (ref 3.4–10.8)

## 2021-11-21 LAB — SPECIMEN STATUS REPORT

## 2021-11-21 LAB — HEPATITIS A ANTIBODY, IGM: Hep A IgM: NEGATIVE

## 2021-11-21 LAB — HIV ANTIBODY (ROUTINE TESTING W REFLEX): HIV Screen 4th Generation wRfx: NONREACTIVE

## 2021-11-21 LAB — HCV INTERPRETATION

## 2021-11-21 LAB — HCG, SERUM, QUALITATIVE: hCG,Beta Subunit,Qual,Serum: NEGATIVE m[IU]/mL (ref <6)

## 2021-11-21 LAB — HEPATITIS C ANTIBODY: Hep C Virus Ab: NONREACTIVE

## 2021-11-22 ENCOUNTER — Encounter: Payer: Self-pay | Admitting: Family

## 2021-11-22 ENCOUNTER — Other Ambulatory Visit: Payer: Self-pay | Admitting: Family

## 2021-11-22 DIAGNOSIS — Z01818 Encounter for other preprocedural examination: Secondary | ICD-10-CM

## 2021-11-22 NOTE — Progress Notes (Signed)
-   Hepatitis A IgM negative.  ?- Hepatitis B negative.  ?- Hepatitis C negative.  ?- HIV negative.  ?- Pregnancy negative.  ?- No diabetes.  ?- No anemia.  ?- Kidney function and electrolytes normal.  ?- Liver function normal. ?- PT and PTT normal (INR, aPTT) . ?- Microscopic urinalysis not indicated and not performed ? ?The following abnormalities are noted:   ?- Hepatitis A total antibody positive. This means you currently have or recently exposed to Hepatitis A within the last 6 months.  Hepatitis C is usually transmitted by fecal-oral route. This may occur either person-to-person contact or consumption of contaminated food or water. Practice good hand hygiene. ?- Hyperthyroidism evident on TSH.  ? ?All other values are normal, stable or within acceptable limits. ? ?Medication changes / Follow up labs / Other changes or recommendations:   ?- Please call our office to schedule hepatitis A vaccination.  ?- Recheck thyroid function in 4 to 6 weeks.  ? ?Laurie Fendt, NP 11/22/2021 7:33 AM  ?

## 2021-11-22 NOTE — Progress Notes (Signed)
Addendum: Hepatitis A is usually transmitted person-to-person or consumption of contaminated food or water.

## 2021-11-22 NOTE — Telephone Encounter (Signed)
Spoke to pt about may need to reschedule surgery due to abnormal TSH labs and Hep A vaccine needed.the patient schedule f/u TSH lab for 12/15/21 will obtain Hep A vaccine at that time  ?

## 2021-12-15 ENCOUNTER — Other Ambulatory Visit: Payer: Medicaid Other

## 2021-12-23 ENCOUNTER — Other Ambulatory Visit: Payer: Medicaid Other

## 2021-12-23 ENCOUNTER — Ambulatory Visit: Payer: Medicaid Other

## 2021-12-23 DIAGNOSIS — Z01818 Encounter for other preprocedural examination: Secondary | ICD-10-CM

## 2021-12-23 DIAGNOSIS — Z23 Encounter for immunization: Secondary | ICD-10-CM

## 2021-12-24 ENCOUNTER — Ambulatory Visit: Payer: Medicaid Other

## 2021-12-24 LAB — TSH+T4F+T3FREE
Free T4: 1.17 ng/dL (ref 0.82–1.77)
T3, Free: 4.4 pg/mL (ref 2.0–4.4)
TSH: 0.658 u[IU]/mL (ref 0.450–4.500)

## 2021-12-28 ENCOUNTER — Ambulatory Visit (INDEPENDENT_AMBULATORY_CARE_PROVIDER_SITE_OTHER): Payer: Medicaid Other

## 2021-12-28 DIAGNOSIS — Z23 Encounter for immunization: Secondary | ICD-10-CM | POA: Diagnosis not present

## 2022-02-02 ENCOUNTER — Ambulatory Visit: Payer: Medicaid Other | Admitting: Physician Assistant

## 2022-02-02 ENCOUNTER — Ambulatory Visit (INDEPENDENT_AMBULATORY_CARE_PROVIDER_SITE_OTHER): Payer: Medicaid Other

## 2022-02-02 ENCOUNTER — Encounter: Payer: Self-pay | Admitting: Family

## 2022-02-02 DIAGNOSIS — Z3042 Encounter for surveillance of injectable contraceptive: Secondary | ICD-10-CM | POA: Diagnosis not present

## 2022-02-02 LAB — POCT URINE PREGNANCY: Preg Test, Ur: NEGATIVE

## 2022-02-02 MED ORDER — MEDROXYPROGESTERONE ACETATE 150 MG/ML IM SUSP
150.0000 mg | Freq: Once | INTRAMUSCULAR | Status: AC
  Administered 2022-02-02: 150 mg via INTRAMUSCULAR

## 2022-03-09 ENCOUNTER — Encounter: Payer: Self-pay | Admitting: Family

## 2022-05-04 ENCOUNTER — Telehealth: Payer: Self-pay | Admitting: Family Medicine

## 2022-05-04 NOTE — Telephone Encounter (Signed)
Patient presented for preoperative clearance for liposuction 360 with fat transfer to buttocks (BBL).

## 2022-05-04 NOTE — Telephone Encounter (Signed)
Copied from [place] [id number]. Topic: Complaint - Billing/Coding >> May 04, 2022 12:16 PM Tiffany B wrote: Requesting additional dx code for 11/17/2021 for labs

## 2022-05-13 ENCOUNTER — Ambulatory Visit: Payer: Medicaid Other | Admitting: Obstetrics and Gynecology

## 2022-05-13 NOTE — Telephone Encounter (Signed)
Called [facility] and spoke w/Cordelia and gave her the info that you provided. She said that they would refile the claim.

## 2022-05-14 NOTE — Telephone Encounter (Signed)
Noted  

## 2022-10-03 ENCOUNTER — Emergency Department (HOSPITAL_COMMUNITY): Payer: Medicaid Other

## 2022-10-03 ENCOUNTER — Other Ambulatory Visit: Payer: Self-pay

## 2022-10-03 ENCOUNTER — Emergency Department (HOSPITAL_COMMUNITY)
Admission: EM | Admit: 2022-10-03 | Discharge: 2022-10-03 | Disposition: A | Discharge status: Home / Self Care | Payer: Medicaid Other | Attending: Emergency Medicine | Admitting: Emergency Medicine

## 2022-10-03 ENCOUNTER — Encounter (HOSPITAL_COMMUNITY): Payer: Self-pay

## 2022-10-03 ENCOUNTER — Emergency Department
Admission: EM | Admit: 2022-10-03 | Discharge: 2022-10-03 | Disposition: A | Discharge status: Home / Self Care | Payer: Medicaid Other | Attending: Emergency Medicine | Admitting: Emergency Medicine

## 2022-10-03 DIAGNOSIS — S0990XA Unspecified injury of head, initial encounter: Secondary | ICD-10-CM | POA: Diagnosis present

## 2022-10-03 DIAGNOSIS — Y92410 Unspecified street and highway as the place of occurrence of the external cause: Secondary | ICD-10-CM | POA: Insufficient documentation

## 2022-10-03 DIAGNOSIS — S0512XA Contusion of eyeball and orbital tissues, left eye, initial encounter: Secondary | ICD-10-CM | POA: Diagnosis not present

## 2022-10-03 DIAGNOSIS — S060X0A Concussion without loss of consciousness, initial encounter: Secondary | ICD-10-CM

## 2022-10-03 DIAGNOSIS — S00212A Abrasion of left eyelid and periocular area, initial encounter: Secondary | ICD-10-CM | POA: Insufficient documentation

## 2022-10-03 DIAGNOSIS — H5712 Ocular pain, left eye: Secondary | ICD-10-CM | POA: Diagnosis present

## 2022-10-03 MED ORDER — KETOROLAC TROMETHAMINE 15 MG/ML IJ SOLN
15.0000 mg | Freq: Once | INTRAMUSCULAR | Status: AC
  Administered 2022-10-03: 15 mg via INTRAMUSCULAR
  Filled 2022-10-03: qty 1

## 2022-10-03 MED ORDER — NAPROXEN 375 MG PO TABS: 375.0000 mg | ORAL_TABLET | Freq: Two times a day (BID) | ORAL | 0 refills | Status: DC

## 2022-10-03 MED ORDER — LIDOCAINE 5 % EX PTCH: 1.0000 | MEDICATED_PATCH | CUTANEOUS | 0 refills | Status: DC

## 2022-10-03 MED ORDER — FLUORESCEIN SODIUM 1 MG OP STRP
2.0000 | ORAL_STRIP | Freq: Once | OPHTHALMIC | Status: AC
  Administered 2022-10-03: 2 via OPHTHALMIC
  Filled 2022-10-03: qty 2

## 2022-10-03 MED ORDER — TETRACAINE HCL 0.5 % OP SOLN
2.0000 [drp] | Freq: Once | OPHTHALMIC | Status: AC
  Administered 2022-10-03: 2 [drp] via OPHTHALMIC
  Filled 2022-10-03: qty 4

## 2022-10-03 MED ORDER — METHOCARBAMOL 500 MG PO TABS: 500.0000 mg | ORAL_TABLET | Freq: Two times a day (BID) | ORAL | 0 refills | Status: DC
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
## 2022-10-03 NOTE — ED Notes (Signed)
Pt discharge to home. Pt VSS, GCS 15, NAD. Pt verbalized understanding of discharge instructions with no additional questions at this time.  

## 2022-10-03 NOTE — Discharge Instructions (Addendum)
Your CT scan does not show any concerning findings.  You likely have a concussion given your mechanism.  I have attached information regarding concussion.  It is normal for you to feel lightheaded occasionally with some nausea, your headache may get worse when you try to do things that require focus.  If you have severe headache, vision change, persistent nausea and vomiting please return to the emergency room for evaluation.  I have also sent in Robaxin which is a muscle relaxer.  This will make you drowsy do not drive after taking this medication.  Also given naproxen and lidocaine patch.
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
## 2022-11-01 ENCOUNTER — Encounter: Payer: Medicaid Other | Admitting: Obstetrics and Gynecology

## 2022-11-22 ENCOUNTER — Encounter: Payer: Self-pay | Admitting: Family

## 2022-12-20 NOTE — Telephone Encounter (Signed)
Please call and schedule an appt for patient concerns. Thank you

## 2023-02-22 ENCOUNTER — Other Ambulatory Visit: Payer: Self-pay

## 2023-02-22 ENCOUNTER — Emergency Department (HOSPITAL_COMMUNITY)
Admission: EM | Admit: 2023-02-22 | Discharge: 2023-02-22 | Discharge status: Against Medical Advice | Payer: Medicaid Other | Attending: Emergency Medicine | Admitting: Emergency Medicine

## 2023-02-22 ENCOUNTER — Encounter (HOSPITAL_COMMUNITY): Payer: Self-pay | Admitting: Emergency Medicine

## 2023-02-22 DIAGNOSIS — O209 Hemorrhage in early pregnancy, unspecified: Secondary | ICD-10-CM | POA: Diagnosis present

## 2023-02-22 DIAGNOSIS — Z3A00 Weeks of gestation of pregnancy not specified: Secondary | ICD-10-CM | POA: Diagnosis not present

## 2023-02-22 DIAGNOSIS — O4690 Antepartum hemorrhage, unspecified, unspecified trimester: Secondary | ICD-10-CM

## 2023-02-22 LAB — BASIC METABOLIC PANEL
Anion gap: 9 (ref 5–15)
BUN: 10 mg/dL (ref 6–20)
CO2: 25 mmol/L (ref 22–32)
Calcium: 8.7 mg/dL — ABNORMAL LOW (ref 8.9–10.3)
Chloride: 102 mmol/L (ref 98–111)
Creatinine, Ser: 0.63 mg/dL (ref 0.44–1.00)
GFR, Estimated: >60 mL/min (ref >60)
Glucose, Bld: 106 mg/dL — ABNORMAL HIGH (ref 70–99)
Potassium: 3.1 mmol/L — ABNORMAL LOW (ref 3.5–5.1)
Sodium: 136 mmol/L (ref 135–145)

## 2023-02-22 LAB — CBC
HCT: 38.3 % (ref 36.0–46.0)
Hemoglobin: 12.6 g/dL (ref 12.0–15.0)
MCH: 27.9 pg (ref 26.0–34.0)
MCHC: 32.9 g/dL (ref 30.0–36.0)
MCV: 84.7 fL (ref 80.0–100.0)
Platelets: 231 10*3/uL (ref 150–400)
RBC: 4.52 MIL/uL (ref 3.87–5.11)
RDW: 13.3 % (ref 11.5–15.5)
WBC: 6.1 10*3/uL (ref 4.0–10.5)
nRBC: 0 % (ref 0.0–0.2)

## 2023-02-22 LAB — HCG, QUANTITATIVE, PREGNANCY: hCG, Beta Chain, Quant, S: 2444 m[IU]/mL — ABNORMAL HIGH (ref <5)

## 2023-02-22 NOTE — ED Triage Notes (Signed)
Pt reports positive pregnancy test 1 month ago. Reports spotting 1 week ago. Pt reports that today when she woke up she had heavy bright red vaginal bleeding. Pt concerned for miscarriage. Pt also reporting abdominal pain.

## 2023-02-22 NOTE — ED Provider Notes (Signed)
[place] EMERGENCY DEPARTMENT AT [facility] Provider Note   CSN: [id number] Arrival date & time: 02/22/23  4098     History  Chief Complaint  Patient presents with   Vaginal Bleeding    Laurie Hansen is a 27 y.o. female.   Vaginal Bleeding Patient presents with vaginal bleeding.  Positive pregnancy test about a month ago.  Last menses was June 10.  Has had some slight vaginal cramping along the month and then became much more severe today.  Has had 2 previous child and states that this feels like contractions.  Has had increased vaginal bleeding now.  No tissue.  Reportedly is Rh-.    Past Medical History:  Diagnosis Date   ADHD (attention deficit hyperactivity disorder)    Attention deficit hyperactivity disorder (ADHD) 12/24/2019   Depression    Headache(784.0)    Suicidal behavior 01/10/2021    Home Medications Prior to Admission medications   Medication Sig Start Date End Date Taking? Authorizing Provider  lidocaine (LIDODERM) 5 % Place 1 patch onto the skin daily. Remove & Discard patch within 12 hours or as directed by MD 10/03/22   Marita Kansas, PA-C  methocarbamol (ROBAXIN) 500 MG tablet Take 1 tablet (500 mg total) by mouth 2 (two) times daily. 10/03/22   Marita Kansas, PA-C  naproxen (NAPROSYN) 375 MG tablet Take 1 tablet (375 mg total) by mouth 2 (two) times daily. 10/03/22   Marita Kansas, PA-C      Allergies    Patient has no known allergies.    Review of Systems   Review of Systems  Genitourinary:  Positive for vaginal bleeding.    Physical Exam Updated Vital Signs BP 128/85 (BP Location: Right Arm)   Pulse 98   Temp 99.2 F (37.3 C) (Oral)   Resp 16   SpO2 100%  Physical Exam Vitals and nursing note reviewed.  Cardiovascular:     Rate and Rhythm: Regular rhythm.  Abdominal:     Tenderness: There is abdominal tenderness.     Comments: Lower abdominal tenderness.  No hernia palpated.  Does feel somewhat full.  Musculoskeletal:         General: No tenderness.     Cervical back: Neck supple.  Skin:    General: Skin is warm.  Neurological:     Mental Status: She is alert and oriented to person, place, and time.     ED Results / Procedures / Treatments   Labs (all labs ordered are listed, but only abnormal results are displayed) Labs Reviewed  HCG, QUANTITATIVE, PREGNANCY - Abnormal; Notable for the following components:      Result Value   hCG, Beta Chain, Quant, S 2,444 (*)    All other components within normal limits  BASIC METABOLIC PANEL - Abnormal; Notable for the following components:   Potassium 3.1 (*)    Glucose, Bld 106 (*)    Calcium 8.7 (*)    All other components within normal limits  CBC    EKG None  Radiology No results found.  Procedures Procedures    Medications Ordered in ED Medications - No data to display  ED Course/ Medical Decision Making/ A&P                                 Medical Decision Making Amount and/or Complexity of Data Reviewed Labs: ordered. Radiology: ordered.   Patient with positive pregnancy test a  month ago.  Now increased cramping increased pain increased bleeding.  Differential diagnosis includes miscarriage, ectopic pregnancy, threatened miscarriage.  Will get pregnancy test.  Will get ultrasound.  Patient took Motrin at home.  Beta-hCG is elevated.  Patient however eloped from ER without informing me.        Final Clinical Impression(s) / ED Diagnoses Final diagnoses:  Vaginal bleeding in pregnancy    Rx / DC Orders ED Discharge Orders     None         Benjiman Core, MD 02/22/23 [phone]

## 2023-02-22 NOTE — ED Notes (Signed)
Pt seen exiting room and department. Provider aware

## 2023-02-24 ENCOUNTER — Inpatient Hospital Stay (HOSPITAL_COMMUNITY)
Admission: AD | Admit: 2023-02-24 | Discharge: 2023-02-24 | Discharge status: Against Medical Advice | Payer: Medicaid Other | Attending: Obstetrics and Gynecology | Admitting: Obstetrics and Gynecology

## 2023-02-24 ENCOUNTER — Encounter (HOSPITAL_COMMUNITY): Payer: Self-pay

## 2023-02-24 ENCOUNTER — Inpatient Hospital Stay (HOSPITAL_COMMUNITY): Payer: Medicaid Other

## 2023-02-24 DIAGNOSIS — O26891 Other specified pregnancy related conditions, first trimester: Secondary | ICD-10-CM | POA: Insufficient documentation

## 2023-02-24 DIAGNOSIS — O209 Hemorrhage in early pregnancy, unspecified: Secondary | ICD-10-CM | POA: Diagnosis not present

## 2023-02-24 DIAGNOSIS — Z5329 Procedure and treatment not carried out because of patient's decision for other reasons: Secondary | ICD-10-CM | POA: Insufficient documentation

## 2023-02-24 DIAGNOSIS — Z3A08 8 weeks gestation of pregnancy: Secondary | ICD-10-CM | POA: Insufficient documentation

## 2023-02-24 DIAGNOSIS — R1030 Lower abdominal pain, unspecified: Secondary | ICD-10-CM | POA: Diagnosis not present

## 2023-02-24 DIAGNOSIS — R109 Unspecified abdominal pain: Secondary | ICD-10-CM

## 2023-02-24 DIAGNOSIS — Z6791 Unspecified blood type, Rh negative: Secondary | ICD-10-CM

## 2023-02-24 LAB — WET PREP, GENITAL
Clue Cells Wet Prep HPF POC: NONE SEEN
Sperm: NONE SEEN
Trich, Wet Prep: NONE SEEN
WBC, Wet Prep HPF POC: <10 (ref <10)
Yeast Wet Prep HPF POC: NONE SEEN

## 2023-02-24 LAB — URINALYSIS, ROUTINE W REFLEX MICROSCOPIC
Bilirubin Urine: NEGATIVE
Glucose, UA: NEGATIVE mg/dL
Ketones, ur: NEGATIVE mg/dL
Leukocytes,Ua: NEGATIVE
Nitrite: NEGATIVE
Protein, ur: NEGATIVE mg/dL
Specific Gravity, Urine: 1.027 (ref 1.005–1.030)
pH: 5 (ref 5.0–8.0)

## 2023-02-24 LAB — CBC
HCT: 34.7 % — ABNORMAL LOW (ref 36.0–46.0)
Hemoglobin: 11.1 g/dL — ABNORMAL LOW (ref 12.0–15.0)
MCH: 26.8 pg (ref 26.0–34.0)
MCHC: 32 g/dL (ref 30.0–36.0)
MCV: 83.8 fL (ref 80.0–100.0)
Platelets: 228 10*3/uL (ref 150–400)
RBC: 4.14 MIL/uL (ref 3.87–5.11)
RDW: 13.5 % (ref 11.5–15.5)
WBC: 5.3 10*3/uL (ref 4.0–10.5)
nRBC: 0 % (ref 0.0–0.2)

## 2023-02-24 LAB — HCG, QUANTITATIVE, PREGNANCY: hCG, Beta Chain, Quant, S: 2115 m[IU]/mL — ABNORMAL HIGH (ref <5)

## 2023-02-24 LAB — RH IG WORKUP (INCLUDES ABO/RH)
ABO/RH(D): A NEG
Antibody Screen: NEGATIVE
Gestational Age(Wks): 8
Unit division: 0

## 2023-02-24 LAB — HIV ANTIBODY (ROUTINE TESTING W REFLEX): HIV Screen 4th Generation wRfx: NONREACTIVE

## 2023-02-24 NOTE — MAU Provider Note (Signed)
History     CSN: [id number]  Arrival date and time: 02/24/23 1914   Event Date/Time   First Provider Initiated Contact with Patient 02/24/23 1048      Chief Complaint  Patient presents with   Vaginal Bleeding   Abdominal Pain   Ms. Alexandria GENISYS MACKALL is a 27 y.o. year old [id number] female at [redacted]w[redacted]d weeks gestation who presents to MAU reporting lower abdominal pain and vaginal bleeding. She reports the vaginal bleeding started 2 weeks ago. She had been wearing super plus tampons and saturating through them. She reports the VB is now "small to moderate." Her abdominal pain started 02/19/2023; last felt @ 2230. She took Ibuprofen 600 mg at 2300 last night. She was seen in the ED yesterday her HCG was 2444, HgB 12.6 yesterday; she left AMA. She denies pain.   OB History     Gravida  4   Para  1   Term  1   Preterm  0   AB  2   Living  1      SAB  0   IAB  2   Ectopic  0   Multiple  0   Live Births  1           Past Medical History:  Diagnosis Date   ADHD (attention deficit hyperactivity disorder)    Attention deficit hyperactivity disorder (ADHD) 12/24/2019   Depression    Headache(784.0)    Suicidal behavior 01/10/2021    History reviewed. No pertinent surgical history.  Family History  Problem Relation Age of Onset   Migraines Mother    Cancer Mother    Lupus Mother    Hypertension Maternal Grandmother    COPD Maternal Grandmother    Asthma Maternal Grandmother    Migraines Paternal Grandmother    Diabetes Paternal Grandmother    Migraines Brother    Migraines Other        Maternal great-grandmother   Migraines Other        Two maternal great aunts   Aneurysm Other        Maternal great uncle    Social History   Tobacco Use   Smoking status: Never    Passive exposure: Never   Smokeless tobacco: Never  Vaping Use   Vaping status: Never Used  Substance Use Topics   Alcohol use: No   Drug use: No    Allergies: No Known Allergies  No  medications prior to admission.    Review of Systems Physical Exam   Blood pressure 116/75, pulse 81, temperature 98.8 F (37.1 C), temperature source Oral, resp. rate 16, height 5\' 5"  (1.651 m), weight 69 kg, last menstrual period 12/28/2022, SpO2 100%.  Physical Exam Constitutional:      Appearance: Normal appearance. She is normal weight.  Cardiovascular:     Rate and Rhythm: Normal rate.  Pulmonary:     Effort: Pulmonary effort is normal.  Abdominal:     Palpations: Abdomen is soft.  Genitourinary:    General: Normal vulva.     Comments: Pelvic exam: External genitalia normal, SE: vaginal walls pink and well rugated, cervix is smooth, pink, no lesions, moderate amt of dark, red blood in vaginal vault d/c -- WP, GC/CT done, cervix visually closed, Uterus is non-tender, (+) CMT, no friability, no adnexal tenderness.  Musculoskeletal:        General: Normal range of motion.  Skin:    General: Skin is warm and dry.  Neurological:  Mental Status: She is alert and oriented to person, place, and time.  Psychiatric:        Mood and Affect: Mood normal.        Behavior: Behavior normal.        Thought Content: Thought content normal.        Judgment: Judgment normal.    MAU Course  Procedures  MDM CCUA UPT CBC ABO/Rh HCG Wet Prep GC/CT -- pending HIV -- pending OB < 14 wks [place] with TV -- NOT DONE -->>patient left AMA before U/S was performed.  Results for orders placed or performed during the hospital encounter of 02/24/23 (from the past 24 hour(s))  Rh IG workup (includes ABO/Rh)     Status: None (Preliminary result)   Collection Time: 02/24/23  9:49 AM  Result Value Ref Range   Gestational Age(Wks) 8    ABO/RH(D) A NEG    Antibody Screen NEG    Unit Number [id number]    Blood Component Type RHIG    Unit division 00    Status of Unit ALLOCATED    Transfusion Status      OK TO TRANSFUSE Performed at [facility], [address], [place], [place]  [address]   hCG, quantitative, pregnancy     Status: Abnormal   Collection Time: 02/24/23  9:52 AM  Result Value Ref Range   hCG, Beta Chain, Quant, S 2,115 (H) <5 mIU/mL  CBC     Status: Abnormal   Collection Time: 02/24/23  9:52 AM  Result Value Ref Range   WBC 5.3 4.0 - 10.5 K/uL   RBC 4.14 3.87 - 5.11 MIL/uL   Hemoglobin 11.1 (L) 12.0 - 15.0 g/dL   HCT 14.7 (L) 82.9 - 56.2 %   MCV 83.8 80.0 - 100.0 fL   MCH 26.8 26.0 - 34.0 pg   MCHC 32.0 30.0 - 36.0 g/dL   RDW 13.0 86.5 - 78.4 %   Platelets 228 150 - 400 K/uL   nRBC 0.0 0.0 - 0.2 %  Urinalysis, Routine w reflex microscopic -Urine, Clean Catch     Status: Abnormal   Collection Time: 02/24/23 10:06 AM  Result Value Ref Range   Color, Urine AMBER (A) YELLOW   APPearance HAZY (A) CLEAR   Specific Gravity, Urine 1.027 1.005 - 1.030   pH 5.0 5.0 - 8.0   Glucose, UA NEGATIVE NEGATIVE mg/dL   Hgb urine dipstick LARGE (A) NEGATIVE   Bilirubin Urine NEGATIVE NEGATIVE   Ketones, ur NEGATIVE NEGATIVE mg/dL   Protein, ur NEGATIVE NEGATIVE mg/dL   Nitrite NEGATIVE NEGATIVE   Leukocytes,Ua NEGATIVE NEGATIVE   RBC / HPF 0-5 0 - 5 RBC/hpf   WBC, UA 0-5 0 - 5 WBC/hpf   Bacteria, UA RARE (A) NONE SEEN   Squamous Epithelial / HPF 6-10 0 - 5 /HPF   Mucus PRESENT   HIV Antibody (routine testing w rflx)     Status: None   Collection Time: 02/24/23 10:27 AM  Result Value Ref Range   HIV Screen 4th Generation wRfx Non Reactive Non Reactive  Wet prep, genital     Status: None   Collection Time: 02/24/23 10:59 AM   Specimen: PATH Cytology Cervicovaginal Ancillary Only  Result Value Ref Range   Yeast Wet Prep HPF POC NONE SEEN NONE SEEN   Trich, Wet Prep NONE SEEN NONE SEEN   Clue Cells Wet Prep HPF POC NONE SEEN NONE SEEN   WBC, Wet Prep HPF POC <10 <  10   Sperm NONE SEEN     Assessment and Plan  1. Left against medical advice - Patient left at 1232 stating "she was tired of waiting and we were wasting her time." She was informed by RN  that the wait was d/t back up in U/S, but patient did not want to wait  2. Vaginal bleeding in pregnancy, first trimester  3. Abdominal pain during pregnancy in first trimester  4. Rh negative status during pregnancy in first trimester - Rhophylac not given d/t patient leaving AMA prior to U/S and completion of MAU's POC  5. [redacted] weeks gestation of pregnancy    Raelyn Mora, [place] 02/24/2023, 10:48 AM

## 2023-02-24 NOTE — MAU Note (Addendum)
..  Laurie Hansen is a 27 y.o. at Unknown here in MAU reporting: Lower abdominal pain as well as vaginal bleeding. She reports the vaginal bleeding began two weeks ago and the abdominal pain began this past Sunday. She reports she was wearing super tampons and was soaking through them. She reports she is now wearing a pad and reports her bleeding is small to moderate and is passing several small blood clots. Denies vaginal itching and vaginal odors. Denies current pain. Reports it last occurred last night.  Seen in ED yesterday but left AMA. Hcg was 2444. Hgb was 12.6.  LMP: 12/28/2022 - regular periods Onset of complaint: two weeks Pain score: 0/10 lower abdomen currently - last felt the pain last night around 2230.  FHT: n/a Lab orders placed from triage: UA

## 2023-02-24 NOTE — MAU Note (Signed)
Patient reports we are wasting her time and is tired of waiting. RN informed patient she is waiting for an [place]. Patient stated she is leaving. Signed AMA form. NP made aware.

## 2023-02-27 ENCOUNTER — Inpatient Hospital Stay (HOSPITAL_COMMUNITY)
Admission: AD | Admit: 2023-02-27 | Discharge: 2023-02-27 | Disposition: A | Discharge status: Home / Self Care | Payer: Medicaid Other | Attending: Obstetrics and Gynecology | Admitting: Obstetrics and Gynecology

## 2023-02-27 ENCOUNTER — Other Ambulatory Visit: Payer: Self-pay

## 2023-02-27 ENCOUNTER — Inpatient Hospital Stay (HOSPITAL_COMMUNITY): Payer: Medicaid Other

## 2023-02-27 DIAGNOSIS — R110 Nausea: Secondary | ICD-10-CM | POA: Diagnosis present

## 2023-02-27 DIAGNOSIS — O039 Complete or unspecified spontaneous abortion without complication: Secondary | ICD-10-CM

## 2023-02-27 DIAGNOSIS — Z23 Encounter for immunization: Secondary | ICD-10-CM | POA: Diagnosis not present

## 2023-02-27 DIAGNOSIS — Z6791 Unspecified blood type, Rh negative: Secondary | ICD-10-CM | POA: Insufficient documentation

## 2023-02-27 DIAGNOSIS — Z2913 Encounter for prophylactic Rho(D) immune globulin: Secondary | ICD-10-CM | POA: Insufficient documentation

## 2023-02-27 DIAGNOSIS — Z3A08 8 weeks gestation of pregnancy: Secondary | ICD-10-CM

## 2023-02-27 DIAGNOSIS — O209 Hemorrhage in early pregnancy, unspecified: Secondary | ICD-10-CM | POA: Diagnosis present

## 2023-02-27 DIAGNOSIS — R1030 Lower abdominal pain, unspecified: Secondary | ICD-10-CM | POA: Diagnosis present

## 2023-02-27 LAB — CBC
HCT: 31.9 % — ABNORMAL LOW (ref 36.0–46.0)
Hemoglobin: 10.4 g/dL — ABNORMAL LOW (ref 12.0–15.0)
MCH: 27.2 pg (ref 26.0–34.0)
MCHC: 32.6 g/dL (ref 30.0–36.0)
MCV: 83.3 fL (ref 80.0–100.0)
Platelets: 202 10*3/uL (ref 150–400)
RBC: 3.83 MIL/uL — ABNORMAL LOW (ref 3.87–5.11)
RDW: 13.5 % (ref 11.5–15.5)
WBC: 5.8 10*3/uL (ref 4.0–10.5)
nRBC: 0 % (ref 0.0–0.2)

## 2023-02-27 LAB — COMPREHENSIVE METABOLIC PANEL
ALT: 58 U/L — ABNORMAL HIGH (ref 0–44)
AST: 66 U/L — ABNORMAL HIGH (ref 15–41)
Albumin: 3.8 g/dL (ref 3.5–5.0)
Alkaline Phosphatase: 77 U/L (ref 38–126)
Anion gap: 11 (ref 5–15)
BUN: 10 mg/dL (ref 6–20)
CO2: 24 mmol/L (ref 22–32)
Calcium: 9.1 mg/dL (ref 8.9–10.3)
Chloride: 102 mmol/L (ref 98–111)
Creatinine, Ser: 0.79 mg/dL (ref 0.44–1.00)
GFR, Estimated: >60 mL/min (ref >60)
Glucose, Bld: 167 mg/dL — ABNORMAL HIGH (ref 70–99)
Potassium: 3.4 mmol/L — ABNORMAL LOW (ref 3.5–5.1)
Sodium: 137 mmol/L (ref 135–145)
Total Bilirubin: 0.6 mg/dL (ref 0.3–1.2)
Total Protein: 6.8 g/dL (ref 6.5–8.1)

## 2023-02-27 LAB — HCG, QUANTITATIVE, PREGNANCY: hCG, Beta Chain, Quant, S: 1528 m[IU]/mL — ABNORMAL HIGH (ref <5)

## 2023-02-27 MED ORDER — OXYCODONE-ACETAMINOPHEN 5-325 MG PO TABS: 1.0000 | ORAL_TABLET | Freq: Four times a day (QID) | ORAL | 0 refills | Status: AC | PRN

## 2023-02-27 MED ORDER — OXYCODONE-ACETAMINOPHEN 5-325 MG PO TABS
2.0000 | ORAL_TABLET | Freq: Once | ORAL | Status: AC
  Administered 2023-02-27: 2 via ORAL
  Filled 2023-02-27: qty 2

## 2023-02-27 MED ORDER — RHO D IMMUNE GLOBULIN 1500 UNIT/2ML IJ SOSY
300.0000 ug | PREFILLED_SYRINGE | Freq: Once | INTRAMUSCULAR | Status: AC
  Administered 2023-02-27: 300 ug via INTRAMUSCULAR
  Filled 2023-02-27: qty 2

## 2023-02-27 MED ORDER — IBUPROFEN 800 MG PO TABS: 800.0000 mg | ORAL_TABLET | Freq: Three times a day (TID) | ORAL | 0 refills | Status: AC

## 2023-02-27 NOTE — MAU Note (Signed)
.  Laurie Hansen is a 27 y.o. at [redacted]w[redacted]d here in MAU reporting: really bad lower abdominal pain, VB, nausea, shaking, and has passed blood clots. States she was seen at [facility] long on the 7th and this has been ongoing since then, but everything worsened tonight. Reports wearing 1 bad since 2200  Onset of complaint: 1200 Pain score: 10 Vitals:   02/27/23 0101  BP: 115/66  Pulse: (!) 115  Resp: 18  Temp: (!) 102.5 F (39.2 C)  SpO2: 100%     FHT:NA  Lab orders placed from triage:  UA

## 2023-02-27 NOTE — MAU Provider Note (Signed)
History     CSN: [id number]  Arrival date and time: 02/27/23 0017   Event Date/Time   First Provider Initiated Contact with Patient 02/27/23 0106      Chief Complaint  Patient presents with   Abdominal Pain   Vaginal Bleeding   Laurie Hansen is a 27 y.o. [id number] at [redacted]w[redacted]d who receives care at [facility].  She presents today for vaginal bleeding.  Patient states bleeding started Aug 9th, but has been getting progressively worse.  She reports she has been passing small clots.  She reports onset of abdominal pain that is in the lower area and like contractions.  Patient reports taking ibuprofen 600mg  with no relief at 12pm.  She currently rates her pain a 10/10 and reports it is a constant cramping similar to contractions.   OB History     Gravida  4   Para  1   Term  1   Preterm  0   AB  2   Living  1      SAB  0   IAB  2   Ectopic  0   Multiple  0   Live Births  1           Past Medical History:  Diagnosis Date   ADHD (attention deficit hyperactivity disorder)    Attention deficit hyperactivity disorder (ADHD) 12/24/2019   Depression    Headache(784.0)    Suicidal behavior 01/10/2021    No past surgical history on file.  Family History  Problem Relation Age of Onset   Migraines Mother    Cancer Mother    Lupus Mother    Hypertension Maternal Grandmother    COPD Maternal Grandmother    Asthma Maternal Grandmother    Migraines Paternal Grandmother    Diabetes Paternal Grandmother    Migraines Brother    Migraines Other        Maternal great-grandmother   Migraines Other        Two maternal great aunts   Aneurysm Other        Maternal great uncle    Social History   Tobacco Use   Smoking status: Never    Passive exposure: Never   Smokeless tobacco: Never  Vaping Use   Vaping status: Never Used  Substance Use Topics   Alcohol use: No   Drug use: No    Allergies: No Known Allergies  Medications Prior to Admission  Medication Sig  Dispense Refill Last Dose   lidocaine (LIDODERM) 5 % Place 1 patch onto the skin daily. Remove & Discard patch within 12 hours or as directed by MD 14 patch 0    methocarbamol (ROBAXIN) 500 MG tablet Take 1 tablet (500 mg total) by mouth 2 (two) times daily. 20 tablet 0    naproxen (NAPROSYN) 375 MG tablet Take 1 tablet (375 mg total) by mouth 2 (two) times daily. 20 tablet 0     Review of Systems  Gastrointestinal:  Positive for abdominal pain.  Genitourinary:  Positive for vaginal bleeding. Negative for vaginal discharge.   Physical Exam   Blood pressure 115/66, pulse (!) 115, temperature (!) 102.5 F (39.2 C), temperature source Oral, resp. rate 18, height 5\' 5"  (1.651 m), weight 69.8 kg, last menstrual period 12/28/2022, SpO2 100%.  Physical Exam Vitals reviewed.  Constitutional:      General: She is not in acute distress.    Appearance: Normal appearance. She is well-developed. She is not ill-appearing.  HENT:  Head: Normocephalic and atraumatic.  Eyes:     Conjunctiva/sclera: Conjunctivae normal.  Cardiovascular:     Rate and Rhythm: Tachycardia present.  Pulmonary:     Effort: Pulmonary effort is normal. No respiratory distress.  Abdominal:     Tenderness: There is abdominal tenderness in the right lower quadrant, suprapubic area and left lower quadrant.  Musculoskeletal:        General: Normal range of motion.     Cervical back: Normal range of motion.  Skin:    General: Skin is warm and dry.  Neurological:     Mental Status: She is alert and oriented to person, place, and time.  Psychiatric:        Mood and Affect: Mood normal.        Behavior: Behavior normal.     MAU Course  Procedures Results for orders placed or performed during the hospital encounter of 02/27/23 (from the past 24 hour(s))  CBC     Status: Abnormal   Collection Time: 02/27/23  1:12 AM  Result Value Ref Range   WBC 5.8 4.0 - 10.5 K/uL   RBC 3.83 (L) 3.87 - 5.11 MIL/uL   Hemoglobin  10.4 (L) 12.0 - 15.0 g/dL   HCT 16.1 (L) 09.6 - 04.5 %   MCV 83.3 80.0 - 100.0 fL   MCH 27.2 26.0 - 34.0 pg   MCHC 32.6 30.0 - 36.0 g/dL   RDW 40.9 81.1 - 91.4 %   Platelets 202 150 - 400 K/uL   nRBC 0.0 0.0 - 0.2 %  Comprehensive metabolic panel     Status: Abnormal   Collection Time: 02/27/23  1:12 AM  Result Value Ref Range   Sodium 137 135 - 145 mmol/L   Potassium 3.4 (L) 3.5 - 5.1 mmol/L   Chloride 102 98 - 111 mmol/L   CO2 24 22 - 32 mmol/L   Glucose, Bld 167 (H) 70 - 99 mg/dL   BUN 10 6 - 20 mg/dL   Creatinine, Ser 7.82 0.44 - 1.00 mg/dL   Calcium 9.1 8.9 - 95.6 mg/dL   Total Protein 6.8 6.5 - 8.1 g/dL   Albumin 3.8 3.5 - 5.0 g/dL   AST 66 (H) 15 - 41 U/L   ALT 58 (H) 0 - 44 U/L   Alkaline Phosphatase 77 38 - 126 U/L   Total Bilirubin 0.6 0.3 - 1.2 mg/dL   GFR, Estimated >21 >30 mL/min   Anion gap 11 5 - 15  hCG, quantitative, pregnancy     Status: Abnormal   Collection Time: 02/27/23  1:12 AM  Result Value Ref Range   hCG, Beta Chain, Quant, S 1,528 (H) <5 mIU/mL   US OB LESS THAN 14 WEEKS WITH OB TRANSVAGINAL  Result Date: 02/27/2023 CLINICAL DATA:  Clinical history of vaginal bleeding with positive pregnancy test EXAM: OBSTETRIC <14 WK [place] AND TRANSVAGINAL OB US TECHNIQUE: Both transabdominal and transvaginal ultrasound examinations were performed for complete evaluation of the gestation as well as the maternal uterus, adnexal regions, and pelvic cul-de-sac. Transvaginal technique was performed to assess early pregnancy. COMPARISON:  None Available. FINDINGS: Intrauterine gestational sac: Absent Maternal uterus/adnexae: Uterus shows no significant decidual reaction and no evidence of intrauterine gestational sac. The beta HCG is trending downward and given the clinical history these changes likely represent recent miscarriage. Right ovary appears within normal limits. 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 Complex free fluid  in the pelvis is noted. IMPRESSION: No evidence of intrauterine or extra uterine gestational sac. In the face of a decreasing beta HCG level, this likely represents changes consistent with recent miscarriage. No significant retained products are noted within the endometrial canal. 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 Nonemergent MRI is recommended for evaluation when clinically able. No other focal abnormality is noted. Electronically Signed   By: Alcide Clever M.D.   On: 02/27/2023 02:22    MDM Labs:  CBC, hCG, ABO Ultrasound Pain medication Assessment and Plan  27 year old G4P1021 at 8.5 weeks Vaginal Bleeding  -POC Reviewed. -Exam performed. -Labs ordered -Will send for [place] -Patient offered and requests pain medication. -In setting of 10/10 pain will give percocet  2 tablets. -Patient agreeable. -Await results and reassess.   Cherre Robins 02/27/2023, 1:06 AM   Reassessment (1:14 AM) Blood bank calls and states patient with Rhogam prepared from previus visit on Friday. Instructs provider to discontinue order.   Reassessment (2:53 AM) -Results as above. -Provider to bedside to discuss. -Patient reports improvement in pain with percocet dosing.  -Patient informed that in setting of decreasing hCG and vaginal bleeding diagnosis of SAB to be given. -Discussed follow up in 2 weeks at primary office. -Patient requests medications for pain management. -Informed that limited script for percocet to be sent as patient responsive. -Bleeding precautions given. -Patient without current questions or concerns. -Rhogam given. -Encouraged to call primary office or return to MAU if symptoms worsen or with the onset of new symptoms. -Discharge to home in stable condition.   Cherre Robins MSN, CNM Advanced Practice Provider, [facility]

## 2023-11-08 ENCOUNTER — Encounter: Payer: Self-pay | Admitting: Family

## 2023-11-08 ENCOUNTER — Ambulatory Visit (INDEPENDENT_AMBULATORY_CARE_PROVIDER_SITE_OTHER): Admitting: Family

## 2023-11-08 VITALS — BP 113/73 | HR 98 | Temp 98.7°F | Resp 16 | Ht 65.0 in | Wt 143.8 lb

## 2023-11-08 DIAGNOSIS — Z3201 Encounter for pregnancy test, result positive: Secondary | ICD-10-CM

## 2023-11-08 LAB — POCT URINE PREGNANCY: Preg Test, Ur: POSITIVE — AB

## 2023-11-08 NOTE — Progress Notes (Unsigned)
 Patient wants to get back on depo.  When patient was on depo she was bleeding a lot so she does not know if she should try something else
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
## 2023-11-09 ENCOUNTER — Encounter: Payer: Self-pay | Admitting: Family

## 2024-04-24 ENCOUNTER — Ambulatory Visit: Admitting: *Deleted

## 2024-04-24 DIAGNOSIS — Z3201 Encounter for pregnancy test, result positive: Secondary | ICD-10-CM

## 2024-04-24 DIAGNOSIS — Z3200 Encounter for pregnancy test, result unknown: Secondary | ICD-10-CM

## 2024-04-24 LAB — POCT PREGNANCY, URINE: Preg Test, Ur: POSITIVE — AB

## 2024-04-24 NOTE — Progress Notes (Signed)
 Laurie Hansen dropped off a urine for a pregnancy test which was positive. I called and notified her of result. She reports LMP of around 8/21-8/28/25. States she is not sure of exact date because she deleted the app already  I advised she is approximately [redacted]w[redacted]d or a week earlier and should start prenatal care with provider office she chooses. She plans to go here again. I reveiwed meds with her and she is going to start OTC PNV gummies. She will call front desk to schedule a new ob visit. Rock Skip PEAK

## 2024-04-24 NOTE — Patient Instructions (Signed)

## 2024-05-22 ENCOUNTER — Telehealth

## 2024-06-05 ENCOUNTER — Encounter: Admitting: Obstetrics and Gynecology

## 2024-10-07 ENCOUNTER — Ambulatory Visit: Admitting: Family
